# Patient Record
Sex: Male | Born: 1955 | Race: White | Hispanic: No | Smoking: Former smoker
Health system: Southern US, Community
[De-identification: ages and names within clinical notes are randomized; demographics above are authoritative.]

## PROBLEM LIST (undated history)

## (undated) DIAGNOSIS — E785 Hyperlipidemia, unspecified: Secondary | ICD-10-CM

## (undated) DIAGNOSIS — N2581 Secondary hyperparathyroidism of renal origin: Secondary | ICD-10-CM

## (undated) DIAGNOSIS — K7469 Other cirrhosis of liver: Secondary | ICD-10-CM

## (undated) DIAGNOSIS — K279 Peptic ulcer, site unspecified, unspecified as acute or chronic, without hemorrhage or perforation: Secondary | ICD-10-CM

## (undated) DIAGNOSIS — A4152 Sepsis due to Pseudomonas: Secondary | ICD-10-CM

## (undated) DIAGNOSIS — I959 Hypotension, unspecified: Secondary | ICD-10-CM

## (undated) DIAGNOSIS — I739 Peripheral vascular disease, unspecified: Secondary | ICD-10-CM

## (undated) DIAGNOSIS — I499 Cardiac arrhythmia, unspecified: Secondary | ICD-10-CM

## (undated) DIAGNOSIS — Z992 Dependence on renal dialysis: Secondary | ICD-10-CM

## (undated) DIAGNOSIS — D649 Anemia, unspecified: Secondary | ICD-10-CM

## (undated) DIAGNOSIS — A498 Other bacterial infections of unspecified site: Secondary | ICD-10-CM

## (undated) DIAGNOSIS — K759 Inflammatory liver disease, unspecified: Secondary | ICD-10-CM

## (undated) DIAGNOSIS — H269 Unspecified cataract: Secondary | ICD-10-CM

## (undated) DIAGNOSIS — J189 Pneumonia, unspecified organism: Secondary | ICD-10-CM

## (undated) DIAGNOSIS — S68119A Complete traumatic metacarpophalangeal amputation of unspecified finger, initial encounter: Secondary | ICD-10-CM

## (undated) DIAGNOSIS — F32A Depression, unspecified: Secondary | ICD-10-CM

## (undated) DIAGNOSIS — K219 Gastro-esophageal reflux disease without esophagitis: Secondary | ICD-10-CM

## (undated) DIAGNOSIS — E161 Other hypoglycemia: Secondary | ICD-10-CM

## (undated) DIAGNOSIS — M87059 Idiopathic aseptic necrosis of unspecified femur: Secondary | ICD-10-CM

## (undated) DIAGNOSIS — I4891 Unspecified atrial fibrillation: Secondary | ICD-10-CM

## (undated) DIAGNOSIS — S72009A Fracture of unspecified part of neck of unspecified femur, initial encounter for closed fracture: Secondary | ICD-10-CM

## (undated) DIAGNOSIS — K5900 Constipation, unspecified: Secondary | ICD-10-CM

## (undated) DIAGNOSIS — N186 End stage renal disease: Secondary | ICD-10-CM

## (undated) DIAGNOSIS — I770 Arteriovenous fistula, acquired: Secondary | ICD-10-CM

## (undated) DIAGNOSIS — E2740 Unspecified adrenocortical insufficiency: Secondary | ICD-10-CM

## (undated) DIAGNOSIS — F329 Major depressive disorder, single episode, unspecified: Secondary | ICD-10-CM

## (undated) DIAGNOSIS — IMO0002 Reserved for concepts with insufficient information to code with codable children: Secondary | ICD-10-CM

## (undated) DIAGNOSIS — K259 Gastric ulcer, unspecified as acute or chronic, without hemorrhage or perforation: Secondary | ICD-10-CM

## (undated) DIAGNOSIS — R1310 Dysphagia, unspecified: Secondary | ICD-10-CM

## (undated) DIAGNOSIS — I2510 Atherosclerotic heart disease of native coronary artery without angina pectoris: Secondary | ICD-10-CM

## (undated) DIAGNOSIS — F419 Anxiety disorder, unspecified: Secondary | ICD-10-CM

---

## 1992-04-21 DIAGNOSIS — N186 End stage renal disease: Secondary | ICD-10-CM

## 1992-04-21 HISTORY — DX: Dependence on renal dialysis: N18.6

## 2013-08-14 ENCOUNTER — Emergency Department (HOSPITAL_COMMUNITY): Payer: Medicare Other

## 2013-08-14 ENCOUNTER — Inpatient Hospital Stay (HOSPITAL_COMMUNITY)
Admission: EM | Admit: 2013-08-14 | Discharge: 2013-08-19 | DRG: 871 | Disposition: E | Payer: Medicare Other | Attending: Pulmonary Disease | Admitting: Pulmonary Disease

## 2013-08-14 ENCOUNTER — Encounter (HOSPITAL_COMMUNITY): Payer: Self-pay | Admitting: Emergency Medicine

## 2013-08-14 ENCOUNTER — Inpatient Hospital Stay (HOSPITAL_COMMUNITY): Payer: Medicare Other

## 2013-08-14 DIAGNOSIS — R6521 Severe sepsis with septic shock: Secondary | ICD-10-CM

## 2013-08-14 DIAGNOSIS — D539 Nutritional anemia, unspecified: Secondary | ICD-10-CM | POA: Diagnosis present

## 2013-08-14 DIAGNOSIS — F3289 Other specified depressive episodes: Secondary | ICD-10-CM | POA: Diagnosis present

## 2013-08-14 DIAGNOSIS — G9340 Encephalopathy, unspecified: Secondary | ICD-10-CM | POA: Diagnosis present

## 2013-08-14 DIAGNOSIS — S68118A Complete traumatic metacarpophalangeal amputation of other finger, initial encounter: Secondary | ICD-10-CM

## 2013-08-14 DIAGNOSIS — Z9115 Patient's noncompliance with renal dialysis: Secondary | ICD-10-CM

## 2013-08-14 DIAGNOSIS — Z7982 Long term (current) use of aspirin: Secondary | ICD-10-CM

## 2013-08-14 DIAGNOSIS — R4182 Altered mental status, unspecified: Secondary | ICD-10-CM

## 2013-08-14 DIAGNOSIS — I96 Gangrene, not elsewhere classified: Secondary | ICD-10-CM | POA: Diagnosis present

## 2013-08-14 DIAGNOSIS — I4891 Unspecified atrial fibrillation: Secondary | ICD-10-CM

## 2013-08-14 DIAGNOSIS — L89109 Pressure ulcer of unspecified part of back, unspecified stage: Secondary | ICD-10-CM | POA: Diagnosis present

## 2013-08-14 DIAGNOSIS — Z91158 Patient's noncompliance with renal dialysis for other reason: Secondary | ICD-10-CM

## 2013-08-14 DIAGNOSIS — Z992 Dependence on renal dialysis: Secondary | ICD-10-CM

## 2013-08-14 DIAGNOSIS — L8992 Pressure ulcer of unspecified site, stage 2: Secondary | ICD-10-CM | POA: Diagnosis present

## 2013-08-14 DIAGNOSIS — K219 Gastro-esophageal reflux disease without esophagitis: Secondary | ICD-10-CM | POA: Diagnosis present

## 2013-08-14 DIAGNOSIS — L8995 Pressure ulcer of unspecified site, unstageable: Secondary | ICD-10-CM | POA: Diagnosis present

## 2013-08-14 DIAGNOSIS — D649 Anemia, unspecified: Secondary | ICD-10-CM

## 2013-08-14 DIAGNOSIS — I469 Cardiac arrest, cause unspecified: Secondary | ICD-10-CM | POA: Diagnosis present

## 2013-08-14 DIAGNOSIS — A419 Sepsis, unspecified organism: Principal | ICD-10-CM | POA: Diagnosis present

## 2013-08-14 DIAGNOSIS — E119 Type 2 diabetes mellitus without complications: Secondary | ICD-10-CM | POA: Diagnosis present

## 2013-08-14 DIAGNOSIS — R188 Other ascites: Secondary | ICD-10-CM | POA: Diagnosis present

## 2013-08-14 DIAGNOSIS — F411 Generalized anxiety disorder: Secondary | ICD-10-CM | POA: Diagnosis present

## 2013-08-14 DIAGNOSIS — R6520 Severe sepsis without septic shock: Secondary | ICD-10-CM

## 2013-08-14 DIAGNOSIS — M87059 Idiopathic aseptic necrosis of unspecified femur: Secondary | ICD-10-CM | POA: Diagnosis present

## 2013-08-14 DIAGNOSIS — E2749 Other adrenocortical insufficiency: Secondary | ICD-10-CM | POA: Diagnosis present

## 2013-08-14 DIAGNOSIS — S72009A Fracture of unspecified part of neck of unspecified femur, initial encounter for closed fracture: Secondary | ICD-10-CM | POA: Diagnosis present

## 2013-08-14 DIAGNOSIS — E876 Hypokalemia: Secondary | ICD-10-CM | POA: Diagnosis present

## 2013-08-14 DIAGNOSIS — K7460 Unspecified cirrhosis of liver: Secondary | ICD-10-CM | POA: Diagnosis present

## 2013-08-14 DIAGNOSIS — L98499 Non-pressure chronic ulcer of skin of other sites with unspecified severity: Secondary | ICD-10-CM | POA: Diagnosis present

## 2013-08-14 DIAGNOSIS — N2581 Secondary hyperparathyroidism of renal origin: Secondary | ICD-10-CM | POA: Diagnosis present

## 2013-08-14 DIAGNOSIS — J9600 Acute respiratory failure, unspecified whether with hypoxia or hypercapnia: Secondary | ICD-10-CM

## 2013-08-14 DIAGNOSIS — N186 End stage renal disease: Secondary | ICD-10-CM

## 2013-08-14 DIAGNOSIS — E785 Hyperlipidemia, unspecified: Secondary | ICD-10-CM | POA: Diagnosis present

## 2013-08-14 DIAGNOSIS — E873 Alkalosis: Secondary | ICD-10-CM | POA: Diagnosis present

## 2013-08-14 DIAGNOSIS — I2510 Atherosclerotic heart disease of native coronary artery without angina pectoris: Secondary | ICD-10-CM | POA: Diagnosis present

## 2013-08-14 DIAGNOSIS — F329 Major depressive disorder, single episode, unspecified: Secondary | ICD-10-CM | POA: Diagnosis present

## 2013-08-14 DIAGNOSIS — S98919A Complete traumatic amputation of unspecified foot, level unspecified, initial encounter: Secondary | ICD-10-CM

## 2013-08-14 DIAGNOSIS — Z66 Do not resuscitate: Secondary | ICD-10-CM | POA: Diagnosis present

## 2013-08-14 DIAGNOSIS — X58XXXA Exposure to other specified factors, initial encounter: Secondary | ICD-10-CM | POA: Diagnosis present

## 2013-08-14 DIAGNOSIS — J189 Pneumonia, unspecified organism: Secondary | ICD-10-CM | POA: Diagnosis present

## 2013-08-14 DIAGNOSIS — I739 Peripheral vascular disease, unspecified: Secondary | ICD-10-CM

## 2013-08-14 DIAGNOSIS — Z515 Encounter for palliative care: Secondary | ICD-10-CM

## 2013-08-14 DIAGNOSIS — Z794 Long term (current) use of insulin: Secondary | ICD-10-CM

## 2013-08-14 DIAGNOSIS — I214 Non-ST elevation (NSTEMI) myocardial infarction: Secondary | ICD-10-CM | POA: Diagnosis present

## 2013-08-14 DIAGNOSIS — R570 Cardiogenic shock: Secondary | ICD-10-CM

## 2013-08-14 DIAGNOSIS — E872 Acidosis, unspecified: Secondary | ICD-10-CM | POA: Diagnosis present

## 2013-08-14 DIAGNOSIS — IMO0002 Reserved for concepts with insufficient information to code with codable children: Secondary | ICD-10-CM

## 2013-08-14 DIAGNOSIS — L89309 Pressure ulcer of unspecified buttock, unspecified stage: Secondary | ICD-10-CM | POA: Diagnosis present

## 2013-08-14 DIAGNOSIS — E46 Unspecified protein-calorie malnutrition: Secondary | ICD-10-CM | POA: Diagnosis present

## 2013-08-14 DIAGNOSIS — Z87891 Personal history of nicotine dependence: Secondary | ICD-10-CM

## 2013-08-14 DIAGNOSIS — D696 Thrombocytopenia, unspecified: Secondary | ICD-10-CM | POA: Diagnosis present

## 2013-08-14 HISTORY — DX: Anemia, unspecified: D64.9

## 2013-08-14 HISTORY — DX: Dysphagia, unspecified: R13.10

## 2013-08-14 HISTORY — DX: Unspecified atrial fibrillation: I48.91

## 2013-08-14 HISTORY — DX: Complete traumatic metacarpophalangeal amputation of unspecified finger, initial encounter: S68.119A

## 2013-08-14 HISTORY — DX: Inflammatory liver disease, unspecified: K75.9

## 2013-08-14 HISTORY — DX: Sepsis due to Pseudomonas: A41.52

## 2013-08-14 HISTORY — DX: Hypotension, unspecified: I95.9

## 2013-08-14 HISTORY — DX: Dependence on renal dialysis: Z99.2

## 2013-08-14 HISTORY — DX: Atherosclerotic heart disease of native coronary artery without angina pectoris: I25.10

## 2013-08-14 HISTORY — DX: End stage renal disease: N18.6

## 2013-08-14 HISTORY — DX: Gastric ulcer, unspecified as acute or chronic, without hemorrhage or perforation: K25.9

## 2013-08-14 HISTORY — DX: Unspecified cataract: H26.9

## 2013-08-14 HISTORY — DX: Other bacterial infections of unspecified site: A49.8

## 2013-08-14 HISTORY — DX: Reserved for concepts with insufficient information to code with codable children: IMO0002

## 2013-08-14 HISTORY — DX: Constipation, unspecified: K59.00

## 2013-08-14 HISTORY — DX: Depression, unspecified: F32.A

## 2013-08-14 HISTORY — DX: Idiopathic aseptic necrosis of unspecified femur: M87.059

## 2013-08-14 HISTORY — DX: Secondary hyperparathyroidism of renal origin: N25.81

## 2013-08-14 HISTORY — DX: Peripheral vascular disease, unspecified: I73.9

## 2013-08-14 HISTORY — DX: Gastro-esophageal reflux disease without esophagitis: K21.9

## 2013-08-14 HISTORY — DX: Cardiac arrhythmia, unspecified: I49.9

## 2013-08-14 HISTORY — DX: Other cirrhosis of liver: K74.69

## 2013-08-14 HISTORY — DX: Other hypoglycemia: E16.1

## 2013-08-14 HISTORY — DX: Arteriovenous fistula, acquired: I77.0

## 2013-08-14 HISTORY — DX: Pneumonia, unspecified organism: J18.9

## 2013-08-14 HISTORY — DX: Fracture of unspecified part of neck of unspecified femur, initial encounter for closed fracture: S72.009A

## 2013-08-14 HISTORY — DX: Major depressive disorder, single episode, unspecified: F32.9

## 2013-08-14 HISTORY — DX: Unspecified adrenocortical insufficiency: E27.40

## 2013-08-14 HISTORY — DX: Anxiety disorder, unspecified: F41.9

## 2013-08-14 HISTORY — DX: Peptic ulcer, site unspecified, unspecified as acute or chronic, without hemorrhage or perforation: K27.9

## 2013-08-14 HISTORY — DX: Hyperlipidemia, unspecified: E78.5

## 2013-08-14 LAB — COMPREHENSIVE METABOLIC PANEL
ALK PHOS: 182 U/L — AB (ref 39–117)
ALT: 20 U/L (ref 0–53)
ALT: 22 U/L (ref 0–53)
AST: 40 U/L — AB (ref 0–37)
AST: 64 U/L — AB (ref 0–37)
Albumin: 1.7 g/dL — ABNORMAL LOW (ref 3.5–5.2)
Albumin: 1.6 g/dL — ABNORMAL LOW (ref 3.5–5.2)
Alkaline Phosphatase: 175 U/L — ABNORMAL HIGH (ref 39–117)
BILIRUBIN TOTAL: 1.1 mg/dL (ref 0.3–1.2)
BUN: 27 mg/dL — ABNORMAL HIGH (ref 6–23)
BUN: 27 mg/dL — ABNORMAL HIGH (ref 6–23)
CHLORIDE: 93 meq/L — AB (ref 96–112)
CO2: 21 mEq/L (ref 19–32)
CO2: 18 meq/L — AB (ref 19–32)
CREATININE: 2.82 mg/dL — AB (ref 0.50–1.35)
Calcium: 9 mg/dL (ref 8.4–10.5)
Calcium: 9.1 mg/dL (ref 8.4–10.5)
Chloride: 92 mEq/L — ABNORMAL LOW (ref 96–112)
Creatinine, Ser: 2.84 mg/dL — ABNORMAL HIGH (ref 0.50–1.35)
GFR calc Af Amer: 26 mL/min — ABNORMAL LOW (ref >90)
GFR calc Af Amer: 27 mL/min — ABNORMAL LOW (ref >90)
GFR calc non Af Amer: 23 mL/min — ABNORMAL LOW (ref >90)
GFR, EST NON AFRICAN AMERICAN: 23 mL/min — AB (ref >90)
GLUCOSE: 124 mg/dL — AB (ref 70–99)
GLUCOSE: 101 mg/dL — AB (ref 70–99)
Potassium: 3.4 mEq/L — ABNORMAL LOW (ref 3.7–5.3)
Potassium: 4.1 mEq/L (ref 3.7–5.3)
SODIUM: 137 meq/L (ref 137–147)
SODIUM: 134 meq/L — AB (ref 137–147)
Total Bilirubin: 1.3 mg/dL — ABNORMAL HIGH (ref 0.3–1.2)
Total Protein: 5.6 g/dL — ABNORMAL LOW (ref 6.0–8.3)
Total Protein: 5.3 g/dL — ABNORMAL LOW (ref 6.0–8.3)

## 2013-08-14 LAB — CBC
HCT: 33.5 % — ABNORMAL LOW (ref 39.0–52.0)
Hemoglobin: 10.7 g/dL — ABNORMAL LOW (ref 13.0–17.0)
MCH: 34.7 pg — ABNORMAL HIGH (ref 26.0–34.0)
MCHC: 31.9 g/dL (ref 30.0–36.0)
MCV: 108.8 fL — AB (ref 78.0–100.0)
PLATELETS: 157 10*3/uL (ref 150–400)
RBC: 3.08 MIL/uL — ABNORMAL LOW (ref 4.22–5.81)
RDW: 16.1 % — ABNORMAL HIGH (ref 11.5–15.5)
WBC: 24.3 10*3/uL — AB (ref 4.0–10.5)

## 2013-08-14 LAB — I-STAT TROPONIN, ED: Troponin i, poc: 0.08 ng/mL (ref 0.00–0.08)

## 2013-08-14 LAB — I-STAT ARTERIAL BLOOD GAS, ED
Acid-Base Excess: 1 mmol/L (ref 0.0–2.0)
Bicarbonate: 26.4 meq/L — ABNORMAL HIGH (ref 20.0–24.0)
O2 Saturation: 100 %
Patient temperature: 98.7
TCO2: 28 mmol/L (ref 0–100)
pCO2 arterial: 45.6 mmHg — ABNORMAL HIGH (ref 35.0–45.0)
pH, Arterial: 7.37 (ref 7.350–7.450)
pO2, Arterial: 238 mmHg — ABNORMAL HIGH (ref 80.0–100.0)

## 2013-08-14 LAB — CBC WITH DIFFERENTIAL/PLATELET
Basophils Absolute: 0 10*3/uL (ref 0.0–0.1)
Basophils Relative: 0 % (ref 0–1)
Eosinophils Absolute: 0.1 10*3/uL (ref 0.0–0.7)
Eosinophils Relative: 0 % (ref 0–5)
HEMATOCRIT: 31.3 % — AB (ref 39.0–52.0)
HEMOGLOBIN: 10.1 g/dL — AB (ref 13.0–17.0)
LYMPHS ABS: 0.8 10*3/uL (ref 0.7–4.0)
LYMPHS PCT: 3 % — AB (ref 12–46)
MCH: 34.2 pg — ABNORMAL HIGH (ref 26.0–34.0)
MCHC: 32.3 g/dL (ref 30.0–36.0)
MCV: 106.1 fL — ABNORMAL HIGH (ref 78.0–100.0)
MONO ABS: 2.7 10*3/uL — AB (ref 0.1–1.0)
MONOS PCT: 11 % (ref 3–12)
NEUTROS ABS: 20.8 10*3/uL — AB (ref 1.7–7.7)
Neutrophils Relative %: 86 % — ABNORMAL HIGH (ref 43–77)
Platelets: 124 10*3/uL — ABNORMAL LOW (ref 150–400)
RBC: 2.95 MIL/uL — AB (ref 4.22–5.81)
RDW: 15.9 % — AB (ref 11.5–15.5)
WBC: 24.4 10*3/uL — AB (ref 4.0–10.5)

## 2013-08-14 LAB — I-STAT CHEM 8, ED
BUN: 26 mg/dL — AB (ref 6–23)
Calcium, Ion: 1.12 mmol/L (ref 1.12–1.23)
Chloride: 97 mEq/L (ref 96–112)
Creatinine, Ser: 2.9 mg/dL — ABNORMAL HIGH (ref 0.50–1.35)
Glucose, Bld: 119 mg/dL — ABNORMAL HIGH (ref 70–99)
HCT: 34 % — ABNORMAL LOW (ref 39.0–52.0)
HEMOGLOBIN: 11.6 g/dL — AB (ref 13.0–17.0)
Potassium: 3.1 mEq/L — ABNORMAL LOW (ref 3.7–5.3)
SODIUM: 134 meq/L — AB (ref 137–147)
TCO2: 23 mmol/L (ref 0–100)

## 2013-08-14 LAB — PRO B NATRIURETIC PEPTIDE
Pro B Natriuretic peptide (BNP): 7638 pg/mL — ABNORMAL HIGH (ref 0–125)
Pro B Natriuretic peptide (BNP): 7668 pg/mL — ABNORMAL HIGH (ref 0–125)

## 2013-08-14 LAB — I-STAT CG4 LACTIC ACID, ED: Lactic Acid, Venous: 7.39 mmol/L — ABNORMAL HIGH (ref 0.5–2.2)

## 2013-08-14 LAB — TROPONIN I
Troponin I: <0.3 ng/mL (ref <0.30)
Troponin I: 1.06 ng/mL (ref <0.30)

## 2013-08-14 LAB — PROTIME-INR
INR: 1.2 (ref 0.00–1.49)
INR: 1.26 (ref 0.00–1.49)
Prothrombin Time: 14.9 seconds (ref 11.6–15.2)
Prothrombin Time: 15.5 seconds — ABNORMAL HIGH (ref 11.6–15.2)

## 2013-08-14 LAB — GLUCOSE, CAPILLARY
GLUCOSE-CAPILLARY: 89 mg/dL (ref 70–99)
GLUCOSE-CAPILLARY: 91 mg/dL (ref 70–99)

## 2013-08-14 LAB — MRSA PCR SCREENING: MRSA by PCR: NEGATIVE

## 2013-08-14 LAB — CBG MONITORING, ED: Glucose-Capillary: 92 mg/dL (ref 70–99)

## 2013-08-14 LAB — LACTIC ACID, PLASMA: Lactic Acid, Venous: 6.1 mmol/L — ABNORMAL HIGH (ref 0.5–2.2)

## 2013-08-14 LAB — MAGNESIUM: Magnesium: 2.5 mg/dL (ref 1.5–2.5)

## 2013-08-14 MED ORDER — LORAZEPAM 2 MG/ML IJ SOLN
2.0000 mg | Freq: Once | INTRAMUSCULAR | Status: AC
  Administered 2013-08-14: 2 mg via INTRAVENOUS
  Filled 2013-08-14: qty 1

## 2013-08-14 MED ORDER — CHLORHEXIDINE GLUCONATE 0.12 % MT SOLN
15.0000 mL | Freq: Two times a day (BID) | OROMUCOSAL | Status: DC
  Administered 2013-08-14 – 2013-08-17 (×6): 15 mL via OROMUCOSAL
  Filled 2013-08-14 (×6): qty 15

## 2013-08-14 MED ORDER — INSULIN ASPART 100 UNIT/ML ~~LOC~~ SOLN: 2.0000 [IU] | SUBCUTANEOUS | Status: DC

## 2013-08-14 MED ORDER — IPRATROPIUM-ALBUTEROL 20-100 MCG/ACT IN AERS: 6.0000 | INHALATION_SPRAY | RESPIRATORY_TRACT | Status: DC

## 2013-08-14 MED ORDER — FENTANYL CITRATE 0.05 MG/ML IJ SOLN
100.0000 ug | INTRAMUSCULAR | Status: DC | PRN
  Administered 2013-08-14 – 2013-08-15 (×2): 100 ug via INTRAVENOUS
  Administered 2013-08-15: 50 ug via INTRAVENOUS
  Administered 2013-08-15 – 2013-08-17 (×2): 100 ug via INTRAVENOUS
  Filled 2013-08-14 (×5): qty 2

## 2013-08-14 MED ORDER — MIDODRINE HCL 5 MG PO TABS
20.0000 mg | ORAL_TABLET | Freq: Three times a day (TID) | ORAL | Status: DC
  Filled 2013-08-14 (×5): qty 4

## 2013-08-14 MED ORDER — HYDROCORTISONE NA SUCCINATE PF 100 MG IJ SOLR
50.0000 mg | Freq: Four times a day (QID) | INTRAMUSCULAR | Status: DC
  Administered 2013-08-14 – 2013-08-17 (×11): 50 mg via INTRAVENOUS
  Filled 2013-08-14 (×14): qty 1

## 2013-08-14 MED ORDER — MIDODRINE HCL 5 MG PO TABS
10.0000 mg | ORAL_TABLET | Freq: Three times a day (TID) | ORAL | Status: DC
  Filled 2013-08-14 (×2): qty 2

## 2013-08-14 MED ORDER — SODIUM CHLORIDE 0.9 % IV SOLN
1000.0000 mL | Freq: Once | INTRAVENOUS | Status: AC
  Administered 2013-08-14: 1000 mL via INTRAVENOUS

## 2013-08-14 MED ORDER — SODIUM CHLORIDE 0.9 % IV SOLN
1.0000 g | Freq: Once | INTRAVENOUS | Status: AC
  Administered 2013-08-14: 1 g via INTRAVENOUS
  Filled 2013-08-14: qty 10

## 2013-08-14 MED ORDER — DEXTROSE 5 % IV SOLN
1.0000 g | Freq: Once | INTRAVENOUS | Status: AC
  Administered 2013-08-15: 1 g via INTRAVENOUS
  Filled 2013-08-14: qty 1

## 2013-08-14 MED ORDER — IPRATROPIUM-ALBUTEROL 0.5-2.5 (3) MG/3ML IN SOLN
3.0000 mL | RESPIRATORY_TRACT | Status: DC
  Administered 2013-08-14 – 2013-08-17 (×16): 3 mL via RESPIRATORY_TRACT
  Filled 2013-08-14 (×17): qty 3

## 2013-08-14 MED ORDER — PROPOFOL 10 MG/ML IV EMUL
5.0000 ug/kg/min | INTRAVENOUS | Status: DC
  Administered 2013-08-14: 5 ug/kg/min via INTRAVENOUS
  Filled 2013-08-14: qty 100

## 2013-08-14 MED ORDER — SODIUM CHLORIDE 0.9 % IV SOLN
1000.0000 mL | INTRAVENOUS | Status: DC
  Administered 2013-08-14: 1000 mL via INTRAVENOUS

## 2013-08-14 MED ORDER — BIOTENE DRY MOUTH MT LIQD
15.0000 mL | Freq: Four times a day (QID) | OROMUCOSAL | Status: DC
  Administered 2013-08-15 – 2013-08-17 (×10): 15 mL via OROMUCOSAL

## 2013-08-14 MED ORDER — CIPROFLOXACIN IN D5W 400 MG/200ML IV SOLN
400.0000 mg | INTRAVENOUS | Status: DC
  Administered 2013-08-15: 400 mg via INTRAVENOUS
  Filled 2013-08-14 (×2): qty 200

## 2013-08-14 MED ORDER — FENTANYL CITRATE 0.05 MG/ML IJ SOLN
50.0000 ug | Freq: Once | INTRAMUSCULAR | Status: AC
  Administered 2013-08-14: 50 ug via INTRAVENOUS
  Filled 2013-08-14: qty 2

## 2013-08-14 MED ORDER — HEPARIN SODIUM (PORCINE) 5000 UNIT/ML IJ SOLN
5000.0000 [IU] | Freq: Three times a day (TID) | INTRAMUSCULAR | Status: DC
  Administered 2013-08-14: 5000 [IU] via SUBCUTANEOUS
  Filled 2013-08-14 (×2): qty 1

## 2013-08-14 MED ORDER — PANTOPRAZOLE SODIUM 40 MG IV SOLR
40.0000 mg | INTRAVENOUS | Status: DC
  Administered 2013-08-15 – 2013-08-16 (×2): 40 mg via INTRAVENOUS
  Filled 2013-08-14 (×4): qty 40

## 2013-08-14 NOTE — ED Notes (Signed)
One blood culture drawn per Dr.Beaver via femoral artery

## 2013-08-14 NOTE — Progress Notes (Signed)
10:06 PM  I spoke with the sister of Jacob Nicholson and she states that he gets HD on TTS and received HD yesterday, 4/25.  I also spoke with the son and he is unable to make a decision regarding code status.  The sister spoke with the patient and according to her, she asked Jacob Nicholson if he would want to be resuscitated again and she stated that he shook his head no.  She would like us to speak with the son again regarding his code status.    Marrian SalvageJacquelyn S Carzell Saldivar, MD

## 2013-08-14 NOTE — Progress Notes (Signed)
2 RN's from IV team assessed pt and attempted to obtain access. Unable to obtain peripheral access. Patient has a right arm PIV from previous hospital that is red at site. CCM MD aware.

## 2013-08-14 NOTE — ED Provider Notes (Signed)
CSN: [id number]     Arrival date & time 08/18/2013  1501 History   First MD Initiated Contact with Patient 08/06/2013 [phone]     Chief Complaint  Patient presents with  . Cardiac Arrest     (Consider location/radiation/quality/duration/timing/severity/associated sxs/prior Treatment) The history is provided by the EMS personnel.   history of present illness: 58 year old male with history of end-stage renal disease on hemodialysis, diabetes, liver disease and inoperable hip fracture who was being transferred from [facility] so that he could continue to receive dialysis. In route EMS reports the patient began to complain that he was short of breath within minutes she became unresponsive and was found to have a PEA arrest. No shockable rhythm was identified. He was given total of 3 mg epinephrine and had ROSC after the third milligram of epinephrine. Patient was intubated with a 7.0 ET tube. EMS reports patient has been refusing dialysis for the past 3 days. Prior to transfer the facility reported that his blood pressures have been running lower than normal in the 90s systolic.  Past Medical History  Diagnosis Date  . Pneumonia   . Pseudomonas infection   . Hip fracture   . Avascular necrosis of hip     lt HIP  . End stage renal failure on dialysis   . Constipation   . PAD (peripheral artery disease)   . Depression   . Hypotension   . Peptic ulcer disease   . A-fib   . Hyperlipidemia   . Anemia    History reviewed. No pertinent past surgical history. History reviewed. No pertinent family history. History  Substance Use Topics  . Smoking status: Former Games developer  . Smokeless tobacco: Not on file  . Alcohol Use: No    Review of Systems  Unable to perform ROS: Patient unresponsive      Allergies  Hectorol  Home Medications   Prior to Admission medications   Not on File   BP 73/54  Pulse 117  Temp(Src) 98.8 F (37.1 C) (Oral)  Resp 20  Ht 5'  5" (1.651 m)  Wt 201 lb 8 oz (91.4 kg)  BMI 33.53 kg/m2  SpO2 100% Physical Exam  Nursing note and vitals reviewed. Constitutional: He appears well-developed and well-nourished. He appears ill. He is intubated.  HENT:  Head: Normocephalic and atraumatic.  Eyes: Pupils are equal, round, and reactive to light.  Neck: Neck supple.  Cardiovascular: Regular rhythm.  Tachycardia present.   Pulses:      Carotid pulses are 2+ on the right side, and 2+ on the left side.      Femoral pulses are 2+ on the right side, and 2+ on the left side. Pulmonary/Chest: He is intubated. He has no decreased breath sounds. He has rhonchi.  Abdominal: Normal appearance and bowel sounds are normal. He exhibits distension.  Musculoskeletal:  Necrotic right 2nd and 3rd digits  Neurological: He is unresponsive. GCS eye subscore is 1. GCS verbal subscore is 1. GCS motor subscore is 1.  Skin: Skin is warm and dry.    ED Course  Procedures (including critical care time) Labs Review Labs Reviewed  CBC - Abnormal; Notable for the following:    WBC 24.3 (*)    RBC 3.08 (*)    Hemoglobin 10.7 (*)    HCT 33.5 (*)    MCV 108.8 (*)    MCH 34.7 (*)    RDW 16.1 (*)    All other components within normal  limits  COMPREHENSIVE METABOLIC PANEL - Abnormal; Notable for the following:    Potassium 3.4 (*)    Chloride 93 (*)    Glucose, Bld 124 (*)    BUN 27 (*)    Creatinine, Ser 2.84 (*)    Total Protein 5.6 (*)    Albumin 1.7 (*)    AST 40 (*)    Alkaline Phosphatase 182 (*)    GFR calc non Af Amer 23 (*)    GFR calc Af Amer 26 (*)    All other components within normal limits  PRO B NATRIURETIC PEPTIDE - Abnormal; Notable for the following:    Pro B Natriuretic peptide (BNP) 7638.0 (*)    All other components within normal limits  COMPREHENSIVE METABOLIC PANEL - Abnormal; Notable for the following:    Sodium 134 (*)    Chloride 92 (*)    CO2 18 (*)    Glucose, Bld 101 (*)    BUN 27 (*)    Creatinine, Ser  2.82 (*)    Total Protein 5.3 (*)    Albumin 1.6 (*)    AST 64 (*)    Alkaline Phosphatase 175 (*)    Total Bilirubin 1.3 (*)    GFR calc non Af Amer 23 (*)    GFR calc Af Amer 27 (*)    All other components within normal limits  LACTIC ACID, PLASMA - Abnormal; Notable for the following:    Lactic Acid, Venous 6.1 (*)    All other components within normal limits  PRO B NATRIURETIC PEPTIDE - Abnormal; Notable for the following:    Pro B Natriuretic peptide (BNP) 7668.0 (*)    All other components within normal limits  TROPONIN I - Abnormal; Notable for the following:    Troponin I 1.06 (*)    All other components within normal limits  CBC WITH DIFFERENTIAL - Abnormal; Notable for the following:    WBC 24.4 (*)    RBC 2.95 (*)    Hemoglobin 10.1 (*)    HCT 31.3 (*)    MCV 106.1 (*)    MCH 34.2 (*)    RDW 15.9 (*)    Platelets 124 (*)    Neutrophils Relative % 86 (*)    Neutro Abs 20.8 (*)    Lymphocytes Relative 3 (*)    Monocytes Absolute 2.7 (*)    All other components within normal limits  PROTIME-INR - Abnormal; Notable for the following:    Prothrombin Time 15.5 (*)    All other components within normal limits  I-STAT CHEM 8, ED - Abnormal; Notable for the following:    Sodium 134 (*)    Potassium 3.1 (*)    BUN 26 (*)    Creatinine, Ser 2.90 (*)    Glucose, Bld 119 (*)    Hemoglobin 11.6 (*)    HCT 34.0 (*)    All other components within normal limits  I-STAT CG4 LACTIC ACID, ED - Abnormal; Notable for the following:    Lactic Acid, Venous 7.39 (*)    All other components within normal limits  I-STAT ARTERIAL BLOOD GAS, ED - Abnormal; Notable for the following:    pCO2 arterial 45.6 (*)    pO2, Arterial 238.0 (*)    Bicarbonate 26.4 (*)    All other components within normal limits  MRSA PCR SCREENING  CULTURE, BLOOD (ROUTINE X 2)  CULTURE, BLOOD (ROUTINE X 2)  CULTURE, EXPECTORATED SPUTUM-ASSESSMENT  PROTIME-INR  MAGNESIUM  TROPONIN I  GLUCOSE, CAPILLARY   GLUCOSE, CAPILLARY  URINALYSIS, ROUTINE W REFLEX MICROSCOPIC  CBC WITH DIFFERENTIAL  BLOOD GAS, ARTERIAL  CORTISOL  URINALYSIS, ROUTINE W REFLEX MICROSCOPIC  CBC  BASIC METABOLIC PANEL  MAGNESIUM  PHOSPHORUS  TROPONIN I  BLOOD GAS, ARTERIAL  HEPARIN LEVEL (UNFRACTIONATED)  CBC  LACTIC ACID, PLASMA  CBG MONITORING, ED  I-STAT TROPOININ, ED    Imaging Review Dg Chest Portable 1 View  09/07/2013   CLINICAL DATA:  Post CPR, intubated  EXAM: PORTABLE CHEST - 1 VIEW  COMPARISON:  None.  FINDINGS: The tip of the endotracheal tube is 1.8 cm above the carina. Inspiratory volumes are very low. Cardiopericardial silhouette is enlarged likely representing cardiomegaly. Nonspecific right perihilar opacities may reflect atelectasis or infiltrate. No pneumothorax or pleural effusion. Advanced degenerative changes of the bilateral shoulder joints. No acute osseous abnormality.  IMPRESSION: 1. The tip of the endotracheal tube is 1.8 cm above the carina. 2. Low inspiratory volumes with the right mid lung and perihilar airspace opacity which could reflect atelectasis or infiltrate. 3. Cardiomegaly.   Electronically Signed   By: Malachy Moan M.D.   On: 09/02/2013 15:44   Dg Chest Port 1v Same Day  09/12/2013   CLINICAL DATA:  Postprocedure SCU film; SS positioning of support tubes and lines.  EXAM: PORTABLE CHEST - 1 VIEW SAME DAY  COMPARISON:  DG CHEST 1V PORT dated 09/07/2013  FINDINGS: The endotracheal tube tip still lies approximately 1.8 cm above the crotch of the carina. The lung volumes are low. The pulmonary interstitial markings have increased bilaterally since the study earlier today. The cardiopericardial silhouette remains enlarged. The pulmonary vascularity is more engorged and indistinct. There is mild gaseous distention of the stomach or bowel in the left upper quadrant of the abdomen. No nasogastric tube is evident. There is no pleural effusion or pneumothorax.  IMPRESSION: 1. There is  persistent bilateral pulmonary hypoinflation. The interstitial markings have increased bilaterally suggesting worsening interstitial edema. 2. The endotracheal tube tip lies approximately 1.8 cm above the crotch of the carina. Withdrawal by 1 cm would be useful to avoid accidental right mainstem bronchus intubation with patient movement. 3. There is mild gaseous distention of the stomach or bowel in the left upper quadrant of the abdomen.   Electronically Signed   By: David  [place]   On: 09/13/2013 17:52     EKG Interpretation   Date/Time:  Sunday 08/25/2013 15:08:08 EDT Ventricular Rate:  112 PR Interval:    QRS Duration: 89 QT Interval:  378 QTC Calculation: 516 R Axis:   68 Text Interpretation:  Atrial fibrillation Premature ventricular complexes  Aberrant conduction of SV complex(es) Low voltage, extremity leads  Borderline repolarization abnormality Prolonged QT interval Abnormal ekg  No previous tracing Confirmed by [facility]  MD, Russella Dar ([id number]) on 19-Aug-2013  4:03:08 PM      MDM   Final diagnoses:  Cardiac arrest    58 year old male history of end-stage renal disease on hemodialysis who has missed dialysis for the past 3 days, diabetes, liver disease, inoperable hip fracture who presents status post CPR while being transferred from [place] [facility]. Received total 3 mg epinephrine then had ROSC. He was intubated during the code with a 7.0 ET tube.  On arrival patient was initially GCS 3. Mental status slowly improved while in the emergency department patient with purposeful movements but unable to follow commands or be extubated. Hypotensive on arrival but was given IV fluids normalization of blood  pressure. He was also given calcium gluconate empirically due to concern for hyperkalemia in the setting of missing dialysis.  Labs remarkable for a normal potassium. Lactic acid 7.4. Leukocytosis of 24. Blood cultures were sent.  History of low blood pressures  earlier today at his facility and concern the patient became hypotensive leading to his arrest.  Critical care consulted and will admit for further management.  Cherre RobinsBryan Letina Luckett, MD 08/15/13 [phone]

## 2013-08-14 NOTE — Progress Notes (Signed)
**Note De-Identified Jacob Nicholson Obfuscation** RT note: ETT pulled back 2cm; currently 25cm at lip

## 2013-08-14 NOTE — ED Notes (Addendum)
PT has been a current PT at [facility] and was in rout [facility] . EMS reported PT was alert and talking to transport staff. Pt became un responsive to staff and was observed to be PEA. CPR started ,Intubation and 3 doses of EPI given IV. EMS staff intubated with  #7 and placed 27 @ the lip..Marland Kitchen

## 2013-08-14 NOTE — Progress Notes (Signed)
**Note De-Identified Jacob Nicholson Obfuscation** RT note: ETCO2 monitored; patient post CPR

## 2013-08-14 NOTE — Procedures (Deleted)
**Note De-Identified Fermon Ureta Obfuscation** Extubation Procedure Note  Patient Details:   Name: Jacob DibbleRandy L Nicholson DOB: 1955-10-03 MRN: [id number]   Airway Documentation:     Evaluation  O2 sats: patient extubated to comfort care Complications: No apparent complications Patient did not tolerate procedure well. Bilateral Breath Sounds: Clear;Diminished   No  Megan SalonWendy Cooper Kaitlynd Phillips 08/18/2013, 4:40 PM

## 2013-08-14 NOTE — H&P (Signed)
PULMONARY / CRITICAL CARE MEDICINE   Name: Jacob Nicholson MRN: [id number] DOB: Mar 12, 1956    ADMISSION DATE:  08/13/2013 CONSULTATION DATE:  4/26   REFERRING MD :  Oletta Lamas  PRIMARY SERVICE: PCCM   CHIEF COMPLAINT:  Respiratory arrest   BRIEF PATIENT DESCRIPTION:    58 male who resides at SNF w/ ESRD just discharged from [facility] and [facility];  after admission from 4/17 to 4/27 where he was treated for pneumonia, pseudomonas bacteremia, and severe left hip pain d/t avascular necrosis, and femoral neck fracture which when referred to [facility] was returned back and deemed not an operable candidate. He was felt stable to d/c to [facility] where he could get HD in the room and continue supportive care as well as pain management. While in route to [facility] he developed acute shortness of breath, chest discomfort and subsequently became unresponsive f/b PEA arrest. He was intubated, underwent 2 rounds of CPR, w/ rapid ROSC. In er he was awake on vent. Followed some commands. PCCM asked to admit.   SIGNIFICANT EVENTS / STUDIES:  Echo 4/26>>>  LINES / TUBES: OETT 4/26>>>  CULTURES: bc x2 4/26>>>  ANTIBIOTICS: cipro 4/26>>> Cefepime 4/26>>>  HISTORY OF PRESENT ILLNESS:   58 year old male w/ ESRD just discharged from [facility] and was in route to be transported to [facility]  After admission from 4/17 to 4/27 where he was treated for pneumonia, pseudomonas bacteremia, and severe left hip pain d/t avascular necrosis, and femoral neck fracture which when referred to [facility] was returned back and deemed not an operable candidate. He was felt stable to d/c to [facility] where he could get HD in the room and continue supportive care as well as pain management. While in route to [facility] he developed acute shortness of breath, chest discomfort and subsequently became unresponsive f/b PEA arrest. He was intubated, underwent 2 rounds of CPR,  w/ rapid ROSC. In er he was awake on vent. Followed some commands. PCCM asked to admit.  PAST MEDICAL HISTORY :  Past Medical History  Diagnosis Date  . Pneumonia   . Pseudomonas infection   . Hip fracture   . Avascular necrosis of hip     lt HIP  . End stage renal failure on dialysis   . Constipation   . PAD (peripheral artery disease)   . Depression   . Hypotension   . Peptic ulcer disease   . A-fib   . Hyperlipidemia   . Anemia    History reviewed. No pertinent past surgical history. Prior to Admission medications   Medication Sig Start Date End Date Taking? Authorizing Provider  aspirin EC 81 MG tablet Take 81 mg by mouth daily.   Yes Historical Provider, MD  B-Complex-C-Biotin-Minerals-FA (FOLBEE PLUS CZ PO) Take 1 tablet by mouth at bedtime.   Yes Historical Provider, MD  bisacodyl (DULCOLAX) 5 MG EC tablet Take 5 mg by mouth daily as needed (constipation).   Yes Historical Provider, MD  Cefepime HCl 2 GM/100ML SOLN Inject 1 g into the vein daily.   Yes Historical Provider, MD  cinacalcet (SENSIPAR) 60 MG tablet Take 60 mg by mouth 3 (three) times a week. Tuesday, Thursday and Saturday   Yes Historical Provider, MD  ciprofloxacin (CIPRO IN D5W) 400 MG/200ML SOLN Inject 400 mg into the vein daily.   Yes Historical Provider, MD  docusate sodium (COLACE) 100 MG capsule Take 100 mg by mouth 2 (  two) times daily.   Yes Historical Provider, MD  DULoxetine (CYMBALTA) 20 MG capsule Take 20 mg by mouth at bedtime.   Yes Historical Provider, MD  fentaNYL (DURAGESIC - DOSED MCG/HR) 25 MCG/HR patch Place 25 mcg onto the skin Nicholson 3 (three) days.   Yes Historical Provider, MD  folic acid (FOLVITE) 1 MG tablet Take 1 mg by mouth 2 (two) times daily.   Yes Historical Provider, MD  lactulose (CHRONULAC) 10 GM/15ML solution Take 20 g by mouth 3 (three) times daily.   Yes Historical Provider, MD  MAGNESIUM HYDROXIDE PO Take 30 mLs by mouth daily as needed (constipation). 8% oral suspension   Yes  Historical Provider, MD  methocarbamol (ROBAXIN) 500 MG tablet Take 500 mg by mouth 3 (three) times daily.   Yes Historical Provider, MD  midodrine (PROAMATINE) 5 MG tablet Take 20 mg by mouth 3 (three) times daily with meals.   Yes Historical Provider, MD  omeprazole (PRILOSEC) 40 MG capsule Take 40 mg by mouth 2 (two) times daily.   Yes Historical Provider, MD  oxyCODONE-acetaminophen (PERCOCET) 10-325 MG per tablet Take 1 tablet by mouth Nicholson 6 (six) hours as needed for pain.   Yes Historical Provider, MD  pentoxifylline (TRENTAL) 400 MG CR tablet Take 400 mg by mouth Nicholson evening.   Yes Historical Provider, MD  polyethylene glycol (MIRALAX / GLYCOLAX) packet Take 17 g by mouth daily.   Yes Historical Provider, MD  predniSONE (DELTASONE) 5 MG tablet Take 5 mg by mouth 2 (two) times a week. On Tuesday Thursday and Saturday   Yes Historical Provider, MD  sevelamer carbonate (RENVELA) 800 MG tablet Take 2,400 mg by mouth 3 (three) times daily before meals.   Yes Historical Provider, MD  Vitamin D, Ergocalciferol, (DRISDOL) 50000 UNITS CAPS capsule Take 50,000 Units by mouth Nicholson 30 (thirty) days. On the 1st of each month   Yes Historical Provider, MD   Allergies  Allergen Reactions  . Hectorol [Doxercalciferol] Other (See Comments)    Unknown ([facility])    FAMILY HISTORY:  History reviewed. No pertinent family history. SOCIAL HISTORY:  reports that he has quit smoking. He does not have any smokeless tobacco history on file. He reports that he does not drink alcohol or use illicit drugs.  REVIEW OF SYSTEMS:  Unable   SUBJECTIVE:  On vent  VITAL SIGNS: Temp:  [92.5 F (33.6 C)-95.9 F (35.5 C)] 95.7 F (35.4 C) (04/26 1624) Pulse Rate:  [92-116] 94 (04/26 1624) Resp:  [16-29] 20 (04/26 1624) BP: (68-127)/(23-92) 85/54 mmHg (04/26 1616) SpO2:  [95 %-100 %] 100 % (04/26 1624) FiO2 (%):  [40 %-100 %] 40 % (04/26 1623) Weight:  [87 kg (191 lb 12.8  oz)] 87 kg (191 lb 12.8 oz) (04/26 1557) HEMODYNAMICS:   VENTILATOR SETTINGS: Vent Mode:  [-] PRVC FiO2 (%):  [40 %-100 %] 40 % Set Rate:  [20 bmp] 20 bmp Vt Set:  [580 mL] 580 mL PEEP:  [5 cmH20] 5 cmH20 INTAKE / OUTPUT: Intake/Output     04/25 0701 - 04/26 0700 04/26 0701 - 04/27 0700   I.V. (mL/kg)  1000 (11.5)   Total Intake(mL/kg)  1000 (11.5)   Stool  1   Total Output   1   Net   +999          PHYSICAL EXAMINATION: General:  Chronically ill appearing white male. Now on full vent support  Neuro:  Awake, follows commands. Moves extremities  HEENT:  Poor dentition, orally intubated  Cardiovascular:  irreg irreg  Lungs:  Diffuse rhonchi  Abdomen:  Distended, soft, + bowel sounds  Musculoskeletal:  Generalized weakness Skin:  Diffuse anasarca. Multiple ulcerations and non-healing wounds. Most specifically right ring finger dry-gangrenous wound w/ bone exposure, sacral ulcer not stagable, right ant LE w/ non-stag able ulcer, multiple areas of ecchymosis LABS:  CBC  Recent Labs Lab Aug 29, 2013 1530 2013-08-29 1541  WBC 24.3*  --   HGB 10.7* 11.6*  HCT 33.5* 34.0*  PLT 157  --    Coag's  Recent Labs Lab 08-29-2013 1530  INR 1.20   BMET  Recent Labs Lab 2013/08/29 1530 08/29/13 1541  NA 137 134*  K 3.4* 3.1*  CL 93* 97  CO2 21  --   BUN 27* 26*  CREATININE 2.84* 2.90*  GLUCOSE 124* 119*   Electrolytes  Recent Labs Lab 08-29-2013 1530  CALCIUM 9.0  MG 2.5   Sepsis Markers  Recent Labs Lab 08/29/2013 1541  LATICACIDVEN 7.39*   ABG  Recent Labs Lab 08-29-13 1536  PHART 7.370  PCO2ART 45.6*  PO2ART 238.0*   Liver Enzymes  Recent Labs Lab 08/29/13 1530  AST 40*  ALT 20  ALKPHOS 182*  BILITOT 1.1  ALBUMIN 1.7*   Cardiac Enzymes  Recent Labs Lab 08-29-2013 1530  PROBNP 7638.0*   Glucose  Recent Labs Lab 2013-08-29 1510  GLUCAP 92    Imaging Dg Chest Portable 1 View  08/29/2013   CLINICAL DATA:  Post CPR, intubated  EXAM: PORTABLE  CHEST - 1 VIEW  COMPARISON:  None.  FINDINGS: The tip of the endotracheal tube is 1.8 cm above the carina. Inspiratory volumes are very low. Cardiopericardial silhouette is enlarged likely representing cardiomegaly. Nonspecific right perihilar opacities may reflect atelectasis or infiltrate. No pneumothorax or pleural effusion. Advanced degenerative changes of the bilateral shoulder joints. No acute osseous abnormality.  IMPRESSION: 1. The tip of the endotracheal tube is 1.8 cm above the carina. 2. Low inspiratory volumes with the right mid lung and perihilar airspace opacity which could reflect atelectasis or infiltrate. 3. Cardiomegaly.   Electronically Signed   By: Malachy Moan M.D.   On: 2013/08/29 15:44     CXR: low volume, ETT good position. Right sided airspace disease   ASSESSMENT / PLAN:  PULMONARY A: acute respiratory failure      Cardiopulmonary arrest 4/26     HCAP He actually looks like he could wean, but because we don't have clear etiology of cardiac arrest will hold off until more data available P:   Full vent support F/u abg/cxr Obtain cultures  See ID section   CARDIOVASCULAR A:  PEA arrest  Atrial fib  Chronic hypotension on Midodrine  H/o PVD Wonder if this was cardiac in nature. Not clear what precipitated this arrest. He is NOT a candidate for hypothermia .Aggressive care in this pt appears futile. We will not offer central access or pressors. Have already spoken to the son and began discussion about goals of care.  P:  Admit to ICU  Accept sbp of >80 Tele, cardiac enzymes, echo all ordered  RENAL A:   ESRD Lactic acidosis  No urgent indication for HD Mild hypokalemia  P:    Will consult nephrology in am 4/27 2 runs KCL   GASTROINTESTINAL A:  Protein cal malnutrition  P:   Place gastric tube PPI for SUP  HEMATOLOGIC A:   Anemia  P:  [place] heparin  Trend CBC  INFECTIOUS A:   Recent pneumonia  Recent pseudomonas bacteremia (apparently  source was non-healing gangrenous right ring finger wound). Most recent blood cultures were negative   P:   Pan culture  Empiric GNR coverage Wound ostomy consult  ENDOCRINE A:  DM P:   ssi   NEUROLOGIC A:  Acute encephalopathy       Chronic hip pain  P:   Supportive care  Musculoskeletal  A: Avascular necrosis of hip Left hip fracture  [facility], [facility] back to [place] as deemed NOT A SURGICAL CANDIDATE.  P: Prn analgesia   TODAY'S SUMMARY:  This is an unfortunate 58 year old male w/ ESRD, non-healing wounds, recent pseudomonas bacteremia from dry gangrenous right ring finger wound w/ bone exposure. [facility] he was admitted for acute encephalopathy after he refused HD due to his chronic left hip pain. [facility] which was turned down and sent back. He was to be transferred to [facility] for HD and pain management. While in route he suffered a cardiopulmonary arrest of unclear etiology. This man is not a candidate for any aggressive interventions. We will admit him to the intensive care. Cycle enzymes, obtain ECHO, try to establish etiology of arrest. His highest priority should be discussion about goals of care as his current plan which was [facility] where he can get [facility] and pain medication does not address his several non-treatable issues.    I have personally obtained a history, examined the patient, evaluated laboratory and imaging results, formulated the assessment and plan and placed orders. CRITICAL CARE: The patient is critically ill with multiple organ systems failure and requires high complexity decision making for assessment and support, frequent evaluation and titration of therapies, application of advanced monitoring technologies and extensive interpretation of multiple databases. Critical Care Time devoted to patient care services described in this note is 60  minutes.   Alyson ReedyWesam G. Karter Haire, M.D. Pulmonary and Critical Care Medicine [facility] Pager: [phone]  08/04/2013, 5:14 PM

## 2013-08-14 NOTE — ED Provider Notes (Signed)
I saw and evaluated the patient, reviewed the resident's note and I agree with the findings and plan.   EKG Interpretation None      Pt was being transferred to [facility], has been refusing dialysis.  Pt arrested en route, PEA, responded to epi rounds. Pt arrives more alert, but not following commands, intubated,  Initially hypotensive, receiving IVF's.  Pt cannot be extubated because mental status is still not back to reported baseline.  Pt appears chronically ill, has dry gangrene of fingers, sig ulcerations and likely gangrene of toes bilaterally.  Will give sedation as BP improves with IVF's.  Plan to discuss with intensivist to see and admit to ICU.  Eventual plan to transfer to [facility] once more stable.  Will get plain films, labs, continue aggressive IVF resuscitation despite being dialysis since his airway is protected, consider central line and pressors as needed.  I spoke to a family member who lives far away to provide an update.  She was agreeable to treatment thus far.    CRITICAL CARE Performed by: Gavin PoundMichael Y. Kesley Gaffey Total critical care time: 30 min Critical care time was exclusive of separately billable procedures and treating other patients. Critical care was necessary to treat or prevent imminent or life-threatening deterioration. Critical care was time spent personally by me on the following activities: development of treatment plan with patient and/or surrogate as well as nursing, discussions with consultants, evaluation of patient's response to treatment, examination of patient, obtaining history from patient or surrogate, ordering and performing treatments and interventions, ordering and review of laboratory studies, ordering and review of radiographic studies, pulse oximetry and re-evaluation of patient's condition. \   Results for orders placed during the hospital encounter of 08/09/2013 (from the past 24 hour(s))  CBG MONITORING, ED     Status: None   Collection Time   07/20/2013  3:10 PM      Result Value Ref Range   Glucose-Capillary 92  70 - 99 mg/dL  CBC     Status: Abnormal   Collection Time    07/26/2013  3:30 PM      Result Value Ref Range   WBC 24.3 (*) 4.0 - 10.5 K/uL   RBC 3.08 (*) 4.22 - 5.81 MIL/uL   Hemoglobin 10.7 (*) 13.0 - 17.0 g/dL   HCT 21.333.5 (*) 08.639.0 - 57.852.0 %   MCV 108.8 (*) 78.0 - 100.0 fL   MCH 34.7 (*) 26.0 - 34.0 pg   MCHC 31.9  30.0 - 36.0 g/dL   RDW 46.916.1 (*) 62.911.5 - 52.815.5 %   Platelets 157  150 - 400 K/uL  COMPREHENSIVE METABOLIC PANEL     Status: Abnormal   Collection Time    08/11/2013  3:30 PM      Result Value Ref Range   Sodium 137  137 - 147 mEq/L   Potassium 3.4 (*) 3.7 - 5.3 mEq/L   Chloride 93 (*) 96 - 112 mEq/L   CO2 21  19 - 32 mEq/L   Glucose, Bld 124 (*) 70 - 99 mg/dL   BUN 27 (*) 6 - 23 mg/dL   Creatinine, Ser 4.132.84 (*) 0.50 - 1.35 mg/dL   Calcium 9.0  8.4 - 24.410.5 mg/dL   Total Protein 5.6 (*) 6.0 - 8.3 g/dL   Albumin 1.7 (*) 3.5 - 5.2 g/dL   AST 40 (*) 0 - 37 U/L   ALT 20  0 - 53 U/L   Alkaline Phosphatase 182 (*) 39 - 117 U/L  Total Bilirubin 1.1  0.3 - 1.2 mg/dL   GFR calc non Af Amer 23 (*) >90 mL/min   GFR calc Af Amer 26 (*) >90 mL/min  PROTIME-INR     Status: None   Collection Time    08/09/2013  3:30 PM      Result Value Ref Range   Prothrombin Time 14.9  11.6 - 15.2 seconds   INR 1.20  0.00 - 1.49  MAGNESIUM     Status: None   Collection Time    08/10/2013  3:30 PM      Result Value Ref Range   Magnesium 2.5  1.5 - 2.5 mg/dL  PRO B NATRIURETIC PEPTIDE     Status: Abnormal   Collection Time    07/25/2013  3:30 PM      Result Value Ref Range   Pro B Natriuretic peptide (BNP) 7638.0 (*) 0 - 125 pg/mL  I-STAT ARTERIAL BLOOD GAS, ED     Status: Abnormal   Collection Time    07/31/2013  3:36 PM      Result Value Ref Range   pH, Arterial 7.370  7.350 - 7.450   pCO2 arterial 45.6 (*) 35.0 - 45.0 mmHg   pO2, Arterial 238.0 (*) 80.0 - 100.0 mmHg   Bicarbonate 26.4 (*) 20.0 - 24.0 mEq/L   TCO2 28  0 -  100 mmol/L   O2 Saturation 100.0     Acid-Base Excess 1.0  0.0 - 2.0 mmol/L   Patient temperature 98.7 F     Collection site ARTERIAL LINE     Drawn by :MD     Sample type ARTERIAL    I-STAT TROPOININ, ED     Status: None   Collection Time    08/13/2013  3:39 PM      Result Value Ref Range   Troponin i, poc 0.08  0.00 - 0.08 ng/mL   Comment 3           I-STAT CHEM 8, ED     Status: Abnormal   Collection Time    08/06/2013  3:41 PM      Result Value Ref Range   Sodium 134 (*) 137 - 147 mEq/L   Potassium 3.1 (*) 3.7 - 5.3 mEq/L   Chloride 97  96 - 112 mEq/L   BUN 26 (*) 6 - 23 mg/dL   Creatinine, Ser 1.612.90 (*) 0.50 - 1.35 mg/dL   Glucose, Bld 096119 (*) 70 - 99 mg/dL   Calcium, Ion 0.451.12  4.091.12 - 1.23 mmol/L   TCO2 23  0 - 100 mmol/L   Hemoglobin 11.6 (*) 13.0 - 17.0 g/dL   HCT 81.134.0 (*) 91.439.0 - 78.252.0 %  I-STAT CG4 LACTIC ACID, ED     Status: Abnormal   Collection Time    07/30/2013  3:41 PM      Result Value Ref Range   Lactic Acid, Venous 7.39 (*) 0.5 - 2.2 mmol/L      Filed Vitals:   08/16/2013 1750  BP: 72/52  Pulse: 99  Temp: 96.8 F (36 C)  Resp: 21     Impression: Hypotension Possible PEA arrest SIRS      Gavin PoundMichael Y. Alyxandra Tenbrink, MD 08/10/2013 1827

## 2013-08-14 NOTE — Progress Notes (Signed)
ANTIBIOTIC CONSULT NOTE - INITIAL  Pharmacy Consult for cipro, cefepime  Indication: pneumonia, wound infection  Allergies  Allergen Reactions  . Hectorol [Doxercalciferol] Other (See Comments)    Unknown ([facility]    Patient Measurements: Height: 5\' 5"  (165.1 cm) ([facility] Weight: 191 lb 12.8 oz (87 kg) IBW/kg (Calculated) : 61.5   Vital Signs: Temp: 95.7 F (35.4 C) (04/26 1624) Temp src: Core (Comment) (04/26 1606) BP: 85/54 mmHg (04/26 1616) Pulse Rate: 94 (04/26 1624) Intake/Output from previous day:   Intake/Output from this shift: Total I/O In: 1000 [I.V.:1000] Out: 1 [Stool:1]  Labs:  Recent Labs  07/26/2013 1530 08/10/2013 1541  WBC 24.3*  --   HGB 10.7* 11.6*  PLT 157  --   CREATININE 2.84* 2.90*   Estimated Creatinine Clearance: 27.8 ml/min (by C-G formula based on Cr of 2.9). No results found for this basename: VANCOTROUGH, VANCOPEAK, VANCORANDOM, GENTTROUGH, GENTPEAK, GENTRANDOM, TOBRATROUGH, TOBRAPEAK, TOBRARND, AMIKACINPEAK, AMIKACINTROU, AMIKACIN,  in the last 72 hours   Microbiology: No results found for this or any previous visit (from the past 720 hour(s)).  Medical History: Past Medical History  Diagnosis Date  . Pneumonia   . Pseudomonas infection   . Hip fracture   . Avascular necrosis of hip     lt HIP  . End stage renal failure on dialysis   . Constipation   . PAD (peripheral artery disease)   . Depression   . Hypotension   . Peptic ulcer disease   . A-fib   . Hyperlipidemia   . Anemia     Medications:  See medication history Assessment: 58 yo man who was [facility] when he became unresponsive.  He was on cefepime 1gm IV q24 and cipro 400 mg IV q24 with last doses this am.  He is ESRD on HD TThS.  Goal of Therapy:  Eradication of infection  Plan:  Continue cipro 400 mg IV q24 hours Will give cefepime 1gm X 1 in  am then start dosing 2gm after each HD Will f/u when next HD will be scheduled Monitor cultures and clinical progress  Shalese Strahan Poteet Leiya Keesey 07/25/2013,5:37 PM

## 2013-08-15 ENCOUNTER — Inpatient Hospital Stay (HOSPITAL_COMMUNITY): Payer: Medicare Other

## 2013-08-15 ENCOUNTER — Encounter (HOSPITAL_COMMUNITY): Payer: Self-pay | Admitting: *Deleted

## 2013-08-15 DIAGNOSIS — I369 Nonrheumatic tricuspid valve disorder, unspecified: Secondary | ICD-10-CM

## 2013-08-15 DIAGNOSIS — I4891 Unspecified atrial fibrillation: Secondary | ICD-10-CM

## 2013-08-15 LAB — POCT I-STAT 3, ART BLOOD GAS (G3+)
Acid-Base Excess: 5 mmol/L — ABNORMAL HIGH (ref 0.0–2.0)
Bicarbonate: 26.7 mEq/L — ABNORMAL HIGH (ref 20.0–24.0)
O2 Saturation: 95 %
PH ART: 7.563 — AB (ref 7.350–7.450)
TCO2: 28 mmol/L (ref 0–100)
pCO2 arterial: 29.6 mmHg — ABNORMAL LOW (ref 35.0–45.0)
pO2, Arterial: 66 mmHg — ABNORMAL LOW (ref 80.0–100.0)

## 2013-08-15 LAB — BASIC METABOLIC PANEL
BUN: 30 mg/dL — AB (ref 6–23)
CALCIUM: 9.5 mg/dL (ref 8.4–10.5)
CO2: 25 meq/L (ref 19–32)
Chloride: 92 mEq/L — ABNORMAL LOW (ref 96–112)
Creatinine, Ser: 3.11 mg/dL — ABNORMAL HIGH (ref 0.50–1.35)
GFR calc Af Amer: 24 mL/min — ABNORMAL LOW (ref >90)
GFR, EST NON AFRICAN AMERICAN: 20 mL/min — AB (ref >90)
GLUCOSE: 108 mg/dL — AB (ref 70–99)
Potassium: 3.4 mEq/L — ABNORMAL LOW (ref 3.7–5.3)
Sodium: 133 mEq/L — ABNORMAL LOW (ref 137–147)

## 2013-08-15 LAB — CBC
HCT: 30.1 % — ABNORMAL LOW (ref 39.0–52.0)
HCT: 32.7 % — ABNORMAL LOW (ref 39.0–52.0)
HEMOGLOBIN: 9.7 g/dL — AB (ref 13.0–17.0)
Hemoglobin: 10.6 g/dL — ABNORMAL LOW (ref 13.0–17.0)
MCH: 33.9 pg (ref 26.0–34.0)
MCH: 34.5 pg — ABNORMAL HIGH (ref 26.0–34.0)
MCHC: 32.2 g/dL (ref 30.0–36.0)
MCHC: 32.4 g/dL (ref 30.0–36.0)
MCV: 105.2 fL — AB (ref 78.0–100.0)
MCV: 106.5 fL — ABNORMAL HIGH (ref 78.0–100.0)
Platelets: 120 10*3/uL — ABNORMAL LOW (ref 150–400)
Platelets: 179 10*3/uL (ref 150–400)
RBC: 2.86 MIL/uL — AB (ref 4.22–5.81)
RBC: 3.07 MIL/uL — ABNORMAL LOW (ref 4.22–5.81)
RDW: 16.4 % — ABNORMAL HIGH (ref 11.5–15.5)
RDW: 17 % — ABNORMAL HIGH (ref 11.5–15.5)
WBC: 23 10*3/uL — ABNORMAL HIGH (ref 4.0–10.5)
WBC: 32 10*3/uL — AB (ref 4.0–10.5)

## 2013-08-15 LAB — TROPONIN I
TROPONIN I: 1.29 ng/mL — AB (ref <0.30)
TROPONIN I: 0.83 ng/mL — AB (ref <0.30)
TROPONIN I: 0.65 ng/mL — AB (ref <0.30)
Troponin I: 1.04 ng/mL (ref <0.30)

## 2013-08-15 LAB — MAGNESIUM: Magnesium: 2.3 mg/dL (ref 1.5–2.5)

## 2013-08-15 LAB — LACTIC ACID, PLASMA: Lactic Acid, Venous: 4.3 mmol/L — ABNORMAL HIGH (ref 0.5–2.2)

## 2013-08-15 LAB — GLUCOSE, CAPILLARY
GLUCOSE-CAPILLARY: 85 mg/dL (ref 70–99)
Glucose-Capillary: 104 mg/dL — ABNORMAL HIGH (ref 70–99)
Glucose-Capillary: 101 mg/dL — ABNORMAL HIGH (ref 70–99)
Glucose-Capillary: 101 mg/dL — ABNORMAL HIGH (ref 70–99)
Glucose-Capillary: 101 mg/dL — ABNORMAL HIGH (ref 70–99)

## 2013-08-15 LAB — HEPARIN LEVEL (UNFRACTIONATED)
HEPARIN UNFRACTIONATED: 0.34 [IU]/mL (ref 0.30–0.70)
Heparin Unfractionated: 0.5 IU/mL (ref 0.30–0.70)

## 2013-08-15 LAB — CORTISOL: Cortisol, Plasma: 21.4 ug/dL

## 2013-08-15 LAB — PHOSPHORUS: Phosphorus: 3.3 mg/dL (ref 2.3–4.6)

## 2013-08-15 MED ORDER — PNEUMOCOCCAL VAC POLYVALENT 25 MCG/0.5ML IJ INJ
0.5000 mL | INJECTION | INTRAMUSCULAR | Status: AC
  Administered 2013-08-16: 0.5 mL via INTRAMUSCULAR
  Filled 2013-08-15: qty 0.5

## 2013-08-15 MED ORDER — ASPIRIN 300 MG RE SUPP
300.0000 mg | Freq: Every day | RECTAL | Status: DC
  Administered 2013-08-15: 300 mg via RECTAL
  Filled 2013-08-15 (×2): qty 1

## 2013-08-15 MED ORDER — DEXTROSE 5 % IV SOLN
30.0000 ug/min | INTRAVENOUS | Status: DC
Start: 2013-08-15 — End: 2013-08-17
  Administered 2013-08-15: 60 ug/min via INTRAVENOUS
  Administered 2013-08-16: 30 ug/min via INTRAVENOUS
  Filled 2013-08-15 (×3): qty 4

## 2013-08-15 MED ORDER — MIDODRINE HCL 5 MG PO TABS: 20.0000 mg | ORAL_TABLET | Freq: Three times a day (TID) | ORAL | Status: DC

## 2013-08-15 MED ORDER — SODIUM CHLORIDE 0.9 % IV SOLN
1000.0000 mL | INTRAVENOUS | Status: DC
  Administered 2013-08-16: 250 mL via INTRAVENOUS

## 2013-08-15 MED ORDER — MIDODRINE HCL 5 MG PO TABS
10.0000 mg | ORAL_TABLET | Freq: Three times a day (TID) | ORAL | Status: DC
  Administered 2013-08-15 – 2013-08-16 (×2): 10 mg
  Filled 2013-08-15 (×5): qty 2

## 2013-08-15 MED ORDER — HEPARIN (PORCINE) IN NACL 100-0.45 UNIT/ML-% IJ SOLN
1350.0000 [IU]/h | INTRAMUSCULAR | Status: DC
  Administered 2013-08-15: 1300 [IU]/h via INTRAVENOUS
  Administered 2013-08-15 – 2013-08-17 (×3): 1350 [IU]/h via INTRAVENOUS
  Filled 2013-08-15 (×5): qty 250

## 2013-08-15 MED ORDER — PHENYLEPHRINE HCL 10 MG/ML IJ SOLN
30.0000 ug/min | INTRAVENOUS | Status: DC
  Administered 2013-08-15: 30 ug/min via INTRAVENOUS
  Filled 2013-08-15: qty 1

## 2013-08-15 NOTE — Progress Notes (Signed)
ANTICOAGULATION CONSULT NOTE - Initial Consult  Pharmacy Consult for heparin Indication: r/o PE and NSTEMI  Allergies  Allergen Reactions  . Hectorol [Doxercalciferol] Other (See Comments)    Unknown ([facility])    Patient Measurements: Height: 5\' 5"  (165.1 cm) ([facility] Weight: 200 lb 13.4 oz (91.1 kg) IBW/kg (Calculated) : 61.5 Heparin Dosing Weight: 82kg  Vital Signs: Temp: 98.2 F (36.8 C) (04/27 1900) Temp src: Oral (04/27 1900) BP: 78/46 mmHg (04/27 1800) Pulse Rate: 109 (04/27 1800)  Labs:  Recent Labs  08/05/2013 1530 08/05/2013 1541  07/29/2013 1740  07/31/2013 2325 08/15/13 0330 08/15/13 0845 08/15/13 0900 08/15/13 1400 08/15/13 1716 08/15/13 1852  HGB 10.7* 11.6*  --   --   --  10.1* 9.7*  --   --   --  10.6*  --   HCT 33.5* 34.0*  --   --   --  31.3* 30.1*  --   --   --  32.7*  --   PLT 157  --   --   --   --  124* 120*  --   --   --  179  --   LABPROT 14.9  --   --   --   --  15.5*  --   --   --   --   --   --   INR 1.20  --   --   --   --  1.26  --   --   --   --   --   --   HEPARINUNFRC  --   --   --   --   --   --   --  0.34  --   --   --  0.50  CREATININE 2.84* 2.90*  --  2.82*  --   --  3.11*  --   --   --   --   --   TROPONINI  --   --   < >  --   < >  --  1.29*  --  1.04* 0.83*  --   --   < > = values in this interval not displayed.  Estimated Creatinine Clearance: 26.5 ml/min (by C-G formula based on Cr of 3.11).   Medical History: Past Medical History  Diagnosis Date  . Pneumonia   . Pseudomonas infection   . Hip fracture   . Avascular necrosis of hip     lt HIP  . End stage renal failure on dialysis 1994    hd tuesday, thursday, saturday  . Constipation   . PAD (peripheral artery disease)   . Depression   . Hypotension     on midodrine  . Peptic ulcer disease   . A-fib   . Hyperlipidemia   . Anemia   . Foot amputation status     amputation of all toes on  left foot, right foot   . A-V fistula     left arm  . Adrenal insufficiency     chronic predinsone use  . Swallowing difficulty   . Hyperparathyroidism, secondary     sensipar 3 times weekly  . Gastric ulcer   . Other specified hypoglycemia     due to poor po intake  . Sepsis due to Pseudomonas     [facility]  . 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 Pneumonia  left lower lobe  . PVD (peripheral vascular disease)   . Foot amputation status     Rt great toe and toes 3&4 amputated  . Amputation finger     left middle finger amputated  . Amputated finger     Right fingers 2, 4 & 5 snipped and necrotic  . Cataract of both eyes   . Coronary artery disease   . Dysrhythmia   . Anxiety   . GERD (gastroesophageal reflux disease)   . Hepatitis     Medications:  Prescriptions prior to admission  Medication Sig Dispense Refill  . acetaminophen (TYLENOL) 325 MG tablet Take 650 mg by mouth every 4 (four) hours as needed for mild pain or fever.      Marland Kitchen. aspirin EC 81 MG tablet Take 81 mg by mouth daily.      Marland Kitchen. B-Complex-C-Biotin-Minerals-FA (FOLBEE PLUS CZ PO) Take 1 tablet by mouth at bedtime.      . bisacodyl (DULCOLAX) 5 MG EC tablet Take 10 mg by mouth daily as needed for moderate constipation.      . Cefepime HCl 2 GM/100ML SOLN Inject 1 g into the vein daily.      . cinacalcet (SENSIPAR) 60 MG tablet Take 60 mg by mouth 3 (three) times a week. Tuesday, Thursday and Saturday      . ciprofloxacin (CIPRO IN D5W) 400 MG/200ML SOLN Inject 400 mg into the vein daily.      Marland Kitchen. docusate sodium (COLACE) 100 MG capsule Take 100 mg by mouth 2 (two) times daily.      . DULoxetine (CYMBALTA) 20 MG capsule Take 20 mg by mouth at bedtime.      . fentaNYL (DURAGESIC - DOSED MCG/HR) 25 MCG/HR patch Place 25 mcg onto the skin every 3 (three) days.      . folic acid (FOLVITE) 1 MG tablet Take 1 mg by mouth 2 (two) times daily.      Marland Kitchen.  lactulose (CHRONULAC) 10 GM/15ML solution Take 20 g by mouth 3 (three) times daily.      . methocarbamol (ROBAXIN) 500 MG tablet Take 500 mg by mouth 3 (three) times daily.      . midodrine (PROAMATINE) 5 MG tablet Take 20 mg by mouth 3 (three) times daily with meals.      Marland Kitchen. oxyCODONE (OXY IR/ROXICODONE) 5 MG immediate release tablet Take 10 mg by mouth every 4 (four) hours as needed for severe pain.      Marland Kitchen. oxymorphone (OPANA ER) 10 MG 12 hr tablet Take 20 mg by mouth every 12 (twelve) hours.      . pantoprazole (PROTONIX) 40 MG tablet Take 40 mg by mouth daily.      . pentoxifylline (TRENTAL) 400 MG CR tablet Take 400 mg by mouth every evening.      . polyethylene glycol (MIRALAX / GLYCOLAX) packet Take 17 g by mouth daily.      . predniSONE (DELTASONE) 5 MG tablet Take 5 mg by mouth 3 (three) times a week. On Tuesday, Thursday, Saturday      . sevelamer carbonate (RENVELA) 800 MG tablet Take 2,400 mg by mouth 3 (three) times daily before meals.      . Vitamin D, Ergocalciferol, (DRISDOL) 50000 UNITS CAPS capsule Take 50,000 Units by mouth every 30 (thirty) days. On the 1st of each month      . zolpidem (AMBIEN) 5 MG tablet Take 5 mg by mouth at bedtime as needed for sleep.  Scheduled:  . antiseptic oral rinse  15 mL Mouth Rinse QID  . aspirin  300 mg Rectal Daily  . chlorhexidine  15 mL Mouth Rinse BID  . ciprofloxacin  400 mg Intravenous Q24H  . hydrocortisone sodium succinate  50 mg Intravenous Q6H  . insulin aspart  2-6 Units Subcutaneous 6 times per day  . ipratropium-albuterol  3 mL Nebulization Q4H  . midodrine  10 mg Per Tube TID WC  . pantoprazole (PROTONIX) IV  40 mg Intravenous Q24H  . [START ON 08/16/2013] pneumococcal 23 valent vaccine  0.5 mL Intramuscular Tomorrow-1000   Infusions:  . sodium chloride    . heparin 1,350 Units/hr (08/15/13 1055)  . phenylephrine (NEO-SYNEPHRINE) Adult infusion 100 mcg/min (08/15/13 1830)    Assessment: 58yo male was en route to  transfer from [facility] to [facility] when became SOB w/ chest discomfort and subsequently became unresponsive w/ PEA arrest, rapid ROSC w/ intubation and 2 rounds of CPR, concern for PE and NSTEMI with elevated troponin, to begin heparin gtt. Patient also has h/o afib but only on ASA PTA. CHADS-VASc low at 1 and HAS-BLED score of 3, so likely ok to continue on ASA.   Heparin level 0.34 this remain in goal range at 0.5 this PM.   Goal of Therapy:  Heparin level 0.3-0.7 units/ml Monitor platelets by anticoagulation protocol: Yes   Plan:  Continue IV heparin at 1350 units/hr and recheck in AM.  Annis Lagoy S. Merilynn Finland, PharmD, [facility] Clinical Staff Pharmacist Pager [phone]   08/15/2013 7:58 PM

## 2013-08-15 NOTE — Progress Notes (Signed)
CRITICAL VALUE ALERT  Critical value received:  Troponin 1.06  Date of notification:  08/15/11  Time of notification:  2353  Critical value read back:yes  Nurse who received alert:  Corliss SkainsJuan Claudio RN  MD notified (1st page):  Dr. Tonny BranchGill(CCM)  Time of first page:  MD on unit-told in person 4/27 at 0005  MD notified (2nd page):NA  Time of second page:  Responding MD:  Dr. Delane GingerGill  Time MD responded:  4/27 at 0005

## 2013-08-15 NOTE — Progress Notes (Signed)
  Echocardiogram 2D Echocardiogram has been performed.  Lorn Junesngela D Hazyl Marseille 08/15/2013, 11:48 AM

## 2013-08-15 NOTE — Progress Notes (Addendum)
INITIAL NUTRITION ASSESSMENT  DOCUMENTATION CODES Per approved criteria  -Obesity Unspecified   INTERVENTION: If patient having swallowing difficulty, recommend swallow evaluation by SLP. Diet advancement as able  Pt may need supplementation as diet advances, due to increased nutrient needs for wound healing   NUTRITION DIAGNOSIS: Increased nutrient needs related to wound healing as evidenced by multiple wounds on chest, fingers, and leg.   Goal: Intake to meet >/= 90% of estimated nutrition needs   Monitor:  Diet advancement, weight trends, labs, I/Os  Reason for Assessment: Positive Malnutrition Screening Tool Score   58 y.o. male  Admitting Dx: Respiratory Arrest   ASSESSMENT: Patient is a [age] male who resides at SNF w/ ESRD [facility] and was being transported to [facility];While en route to [facility] he developed acute shortness of breath, chest discomfort and subsequently became unresponsive f/b PEA arrest. He was intubated (4/26), underwent 2 rounds of CPR, w/ rapid ROSC.  Pt extubated 4/27.   Pt unable to communicate for nutrition assessment. Discussed pt with RN- pt with history of poor PO intake and on Nepro Shake supplement TID at SNF.   Nutrition Focused Physical Exam:  Subcutaneous Fat:  Orbital Region: WNL Upper Arm Region: WNL Thoracic and Lumbar Region: NA  Muscle:  Temple Region: WNL Clavicle Bone Region: WNL Clavicle and Acromion Bone Region: WNL Scapular Bone Region: WNL Dorsal Hand: NA Patellar Region: WNL Anterior Thigh Region: WNL Posterior Calf Region: WNL  Edema: +1 generalized edema   Height: Ht Readings from Last 1 Encounters:  08/15/2013 5\' 5"  (1.651 m)    Weight: Wt Readings from Last 1 Encounters:  08/15/13 200 lb 13.4 oz (91.1 kg)    Ideal Body Weight: 61.8 kg   % Ideal Body Weight: 147%   Wt Readings from Last 10 Encounters:  08/15/13 200 lb 13.4 oz (91.1 kg)    Usual Body Weight:  200 lbs   % Usual Body Weight: 100%   BMI:  Body mass index is 33.42 kg/(m^2)., Obese Class I  Estimated Nutritional Needs: Kcal: 1900 - 2100 Protein: 125 - 140 g Fluid: 1.9- 2.1 L/ day  Skin:  Abrasion right side of chest wall Necrosed tissue right index finger Necrosed tissue to right ring finger (bone exposed) Open wound to anterior lower right leg   Diet Order: NPO  EDUCATION NEEDS: -Education not appropriate at this time   Intake/Output Summary (Last 24 hours) at 08/15/13 1053 Last data filed at 08/15/13 1000  Gross per 24 hour  Intake 2806.8 ml  Output      2 ml  Net 2804.8 ml    Last BM: 4/27    Labs:   Recent Labs Lab 08/15/2013 1530 07/28/2013 1541 07/25/2013 1740 08/15/13 0330  NA 137 134* 134* 133*  K 3.4* 3.1* 4.1 3.4*  CL 93* 97 92* 92*  CO2 21  --  18* 25  BUN 27* 26* 27* 30*  CREATININE 2.84* 2.90* 2.82* 3.11*  CALCIUM 9.0  --  9.1 9.5  MG 2.5  --   --  2.3  PHOS  --   --   --  3.3  GLUCOSE 124* 119* 101* 108*    CBG (last 3)   Recent Labs  08/07/2013 2332 08/15/13 0354 08/15/13 0754  GLUCAP 91 104* 101*    Scheduled Meds: . antiseptic oral rinse  15 mL Mouth Rinse QID  . aspirin  300 mg Rectal Daily  . chlorhexidine  15 mL Mouth Rinse  BID  . ciprofloxacin  400 mg Intravenous Q24H  . hydrocortisone sodium succinate  50 mg Intravenous Q6H  . insulin aspart  2-6 Units Subcutaneous 6 times per day  . ipratropium-albuterol  3 mL Nebulization Q4H  . midodrine  20 mg Oral TID WC  . pantoprazole (PROTONIX) IV  40 mg Intravenous Q24H  . [START ON 08/16/2013] pneumococcal 23 valent vaccine  0.5 mL Intramuscular Tomorrow-1000    Continuous Infusions: . sodium chloride    . heparin 1,300 Units/hr (08/15/13 0124)    Past Medical History  Diagnosis Date  . Pneumonia   . Pseudomonas infection   . Hip fracture   . Avascular necrosis of hip     lt HIP  . End stage renal failure on dialysis 1994    hd tuesday, thursday, saturday  .  Constipation   . PAD (peripheral artery disease)   . Depression   . Hypotension     on midodrine  . Peptic ulcer disease   . A-fib   . Hyperlipidemia   . Anemia   . 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 A-V fistula     left arm  . Adrenal insufficiency     chronic predinsone use  . Swallowing difficulty   . Hyperparathyroidism, secondary     sensipar 3 times weekly  . Gastric ulcer   . Other specified hypoglycemia     due to poor po intake    History reviewed. No pertinent past surgical history.  Eppie Gibsonebekah L Matznick, BS Dietetic Intern Pager: [phone]  I agree with student dietitian note; appropriate revisions have been made.  Joaquin CourtsKimberly Anala Whisenant, RD, LDN, CNSC Pager# [phone] After Hours Pager# [phone]

## 2013-08-15 NOTE — Care Management Note (Signed)
    Page 1 of 1   08/15/2013     9:02:21 AM CARE MANAGEMENT NOTE 08/15/2013  Patient:  Miguel DibbleJOHNSON,Chandon L   Account Number:  [id number]  Date Initiated:  08/15/2013  Documentation initiated by:  Junius CreamerWELL,DEBBIE  Subjective/Objective Assessment:   adm w esrd, vent     Action/Plan:   from snf   Anticipated DC Date:     Anticipated DC Plan:    In-house referral  Clinical Social Worker      DC Planning Services  CM consult      Choice offered to / List presented to:             Status of service:   Medicare Important Message given?   (If response is "NO", the following Medicare IM given date fields will be blank) Date Medicare IM given:   Date Additional Medicare IM given:    Discharge Disposition:    Per UR Regulation:  Reviewed for med. necessity/level of care/duration of stay  If discussed at Long Length of Stay Meetings, dates discussed:    Comments:

## 2013-08-15 NOTE — Progress Notes (Signed)
SLP Cancellation Note  Patient Details Name: Jacob Nicholson MRN: [id number] DOB: 1955-07-30   Cancelled treatment:        Attempted to see patient for swallow evaluation.  RN reports pt. Is too lethargic currently, and asks that SLP return 4/28.  Chart review completed, and pt. Will need an objective study prior to initiation of a diet, due to history of possible aspiration.   Lenor DerrickLori T Estephania Licciardi 08/15/2013, 4:37 PM

## 2013-08-15 NOTE — Procedures (Signed)
Extubation Procedure Note  Patient Details:   Name: Jacob DibbleRandy L Nicholson DOB: [birth year] MRN: [id number]   Airway Documentation:     Evaluation  O2 sats: stable throughout Complications: No apparent complications Patient did tolerate procedure well. Bilateral Breath Sounds: Clear Suctioning: Airway No Pt extubated per Dr. Delton CoombesByrum. Pt placed on 4lpm [place] Sp02 100%. Pt will continue to monitored in ICU today.   Melanee Spryicole Lawson Ugochukwu Chichester 08/15/2013, 9:54 AM

## 2013-08-15 NOTE — Progress Notes (Signed)
eLink Physician-Brief Progress Note Patient Name: Jacob DibbleRandy L Vasconcelos DOB: 02/04/1956 MRN: [id number]  Date of Service  08/15/2013   HPI/Events of Note   resp alkalosis  eICU Interventions  Decrease RR   Intervention Category Major Interventions: Respiratory failure - evaluation and management  Lupita LeashDouglas B McQuaid 08/15/2013, 4:39 AM

## 2013-08-15 NOTE — Progress Notes (Addendum)
PULMONARY / CRITICAL CARE MEDICINE   Name: Jacob Nicholson MRN: [id number] DOB: 08-Dec-1955    ADMISSION DATE:  Mar 02, 2014 CONSULTATION DATE:  4/26   REFERRING MD :  Oletta LamasGhiMiguel Dibblem  PRIMARY SERVICE: PCCM   CHIEF COMPLAINT:  Respiratory arrest   BRIEF PATIENT DESCRIPTION:   [id number] male who resides at SNF w/ ESRD [facility] and was being [facility];  after admission from 4/17 to 4/27 where he was treated for pneumonia, pseudomonas bacteremia, and severe left hip pain d/t avascular necrosis, and femoral neck fracture which when [facility] was returned back and deemed not an operable candidate. He was felt stable to d/c to [facility] where he could get HD in the room and continue supportive care as well as pain management. While en route to [facility] he developed acute shortness of breath, chest discomfort and subsequently became unresponsive f/b PEA arrest. He was intubated, underwent 2 rounds of CPR, w/ rapid ROSC. In ED he was awake on vent. Followed some commands. PCCM asked to admit.   SIGNIFICANT EVENTS / STUDIES:  Echo 4/26>>>  LINES / TUBES: OETT 4/26>>>  CULTURES: bc x2 4/26>>> Resp cx 4/27 >>>  ANTIBIOTICS: cipro 4/26>>> Cefepime 4/26>>>   Allergies  Allergen Reactions  . Hectorol [Doxercalciferol] Other (See Comments)    Unknown ([facility])    SUBJECTIVE:  No acute issues  VITAL SIGNS: Temp:  [92.5 F (33.6 C)-99 F (37.2 C)] 99 F (37.2 C) (04/27 0400) Pulse Rate:  [88-119] 116 (04/27 0532) Resp:  [14-29] 16 (04/27 0532) BP: (41-127)/(20-92) 105/88 mmHg (04/27 0532) SpO2:  [44 %-100 %] 100 % (04/27 0532) FiO2 (%):  [40 %-100 %] 40 % (04/27 0521) Weight:  [191 lb 12.8 oz (87 kg)-201 lb 8 oz (91.4 kg)] 200 lb 13.4 oz (91.1 kg) (04/27 0430) HEMODYNAMICS:   VENTILATOR SETTINGS: Vent Mode:  [-] PRVC FiO2 (%):  [40 %-100 %] 40 % Set Rate:  [14 bmp-20 bmp] 14 bmp Vt Set:  [580 mL] 580  mL PEEP:  [5 cmH20] 5 cmH20 Plateau Pressure:  [22 cmH20-23 cmH20] 22 cmH20 INTAKE / OUTPUT: Intake/Output     04/26 0701 - 04/27 0700   I.V. (mL/kg) 2296.8 (25.2)   Total Intake(mL/kg) 2296.8 (25.2)   Stool 2   Total Output 2   Net +2294.8       Stool Occurrence 2 x     PHYSICAL EXAMINATION: General:  Chronically ill appearing white male, no acute distress  Neuro: sedated but opens eyes to name, nods yes/no HEENT:  Poor dentition, orally intubated  Cardiovascular:  irreg irreg, tachycardic Lungs:  Diffuse rhonchi  Abdomen:  Distended, soft, + bowel sounds  Musculoskeletal:  Generalized weakness Skin:  Diffuse anasarca. Multiple ulcerations and non-healing wounds. Most specifically right ring finger dry-gangrenous wound w/ bone exposure, sacral ulcer not stagable, right ant LE w/ non-stag able ulcer, multiple areas of ecchymosis  LABS:  CBC  Recent Labs Lab Apr 17, 2014 1530 Apr 17, 2014 1541 Apr 17, 2014 2325 08/15/13 0330  WBC 24.3*  --  24.4* 23.0*  HGB 10.7* 11.6* 10.1* 9.7*  HCT 33.5* 34.0* 31.3* 30.1*  PLT 157  --  124* 120*   Coag's  Recent Labs Lab Apr 17, 2014 1530 Apr 17, 2014 2325  INR 1.20 1.26   BMET  Recent Labs Lab Apr 17, 2014 1530 Apr 17, 2014 1541 Apr 17, 2014 1740 08/15/13 0330  NA 137 134* 134* 133*  K 3.4* 3.1* 4.1 3.4*  CL 93* 97 92* 92*  CO2  21  --  18* 25  BUN 27* 26* 27* 30*  CREATININE 2.84* 2.90* 2.82* 3.11*  GLUCOSE 124* 119* 101* 108*   Electrolytes  Recent Labs Lab 08-25-2013 1530 08-25-2013 1740 08/15/13 0330  CALCIUM 9.0 9.1 9.5  MG 2.5  --  2.3  PHOS  --   --  3.3   Sepsis Markers  Recent Labs Lab 08/25/2013 1541 2013-08-25 1740 08/15/13 0330  LATICACIDVEN 7.39* 6.1* 4.3*   ABG  Recent Labs Lab 08-25-2013 1536 08/15/13 0402  PHART 7.370 7.563*  PCO2ART 45.6* 29.6*  PO2ART 238.0* 66.0*   Liver Enzymes  Recent Labs Lab 08/25/13 1530 Aug 25, 2013 1740  AST 40* 64*  ALT 20 22  ALKPHOS 182* 175*  BILITOT 1.1 1.3*  ALBUMIN 1.7* 1.6*    Cardiac Enzymes  Recent Labs Lab 2013/08/25 1530 08/25/2013 1659 Aug 25, 2013 2310 08/15/13 0330  TROPONINI  --  <0.30 1.06* 1.29*  PROBNP 7638.0* 7668.0*  --   --    Glucose  Recent Labs Lab Aug 25, 2013 1510 Aug 25, 2013 2021 08-25-13 2332 08/15/13 0354  GLUCAP 92 89 91 104*    Imaging Dg Chest Portable 1 View  08-25-2013   CLINICAL DATA:  Post CPR, intubated  EXAM: PORTABLE CHEST - 1 VIEW  COMPARISON:  None.  FINDINGS: The tip of the endotracheal tube is 1.8 cm above the carina. Inspiratory volumes are very low. Cardiopericardial silhouette is enlarged likely representing cardiomegaly. Nonspecific right perihilar opacities may reflect atelectasis or infiltrate. No pneumothorax or pleural effusion. Advanced degenerative changes of the bilateral shoulder joints. No acute osseous abnormality.  IMPRESSION: 1. The tip of the endotracheal tube is 1.8 cm above the carina. 2. Low inspiratory volumes with the right mid lung and perihilar airspace opacity which could reflect atelectasis or infiltrate. 3. Cardiomegaly.   Electronically Signed   By: Malachy Moan M.D.   On: 08/25/13 15:44   Dg Chest Port 1v Same Day  Aug 25, 2013   CLINICAL DATA:  Postprocedure SCU film; SS positioning of support tubes and lines.  EXAM: PORTABLE CHEST - 1 VIEW SAME DAY  COMPARISON:  DG CHEST 1V PORT dated 08-25-2013  FINDINGS: The endotracheal tube tip still lies approximately 1.8 cm above the crotch of the carina. The lung volumes are low. The pulmonary interstitial markings have increased bilaterally since the study earlier today. The cardiopericardial silhouette remains enlarged. The pulmonary vascularity is more engorged and indistinct. There is mild gaseous distention of the stomach or bowel in the left upper quadrant of the abdomen. No nasogastric tube is evident. There is no pleural effusion or pneumothorax.  IMPRESSION: 1. There is persistent bilateral pulmonary hypoinflation. The interstitial markings have  increased bilaterally suggesting worsening interstitial edema. 2. The endotracheal tube tip lies approximately 1.8 cm above the crotch of the carina. Withdrawal by 1 cm would be useful to avoid accidental right mainstem bronchus intubation with patient movement. 3. There is mild gaseous distention of the stomach or bowel in the left upper quadrant of the abdomen.   Electronically Signed   By: David  [place]   On: 08/25/2013 17:52    ASSESSMENT / PLAN:  PULMONARY A: Acute Respiratory Railure      Cardiopulmonary arrest 4/26     HCAP - RML infiltrate (CXR read from OSH suggests prior PNA was lingular infiltrate with left basilar consolidation) P:   - Full vent support, wean as able - F/u abg/cxr - Duonebs q4h - See ID section   CARDIOVASCULAR A:  PEA arrest  NSTEMI Atrial  fib - not on anticoag (?d/t h/o PUD with ulcer?) Chronic hypotension on Midodrine  H/o PVD - on Trental He is NOT a candidate for hypothermia.  Aggressive care in this pt appears futile. We will not offer central access or pressors. Dr. Molli KnockYacoub spoke with son at admission and began discussion about goals of care.  P:  - Accept SBP of >80 - Cont to cycle CE - Heparin gtt  - Follow Echo results - midodrine TID  - ASA suppository   RENAL A:   ESRD (TTS, last HD Sat 4/25) Lactic acidosis  Mild hypokalemia  P:   - Consult Nephrology this AM  GASTROINTESTINAL A:  Protein cal malnutrition  On Lactulose at home - constipation + cirrhosis (?cryptogenic per chart review) with mod ascites on CT at OSH P:   - PPI for SUP - Nutrition consult depending on GOC  HEMATOLOGIC A:   Macrocytic Anemia, stable Thrombocytopenia, no obvious s/s of bleeding VTE ppx P:  - Heparin gtt  - Trend CBC   INFECTIOUS A:   Leukocytosis Recent pneumonia (CXR with RML infiltrate) Recent pseudomonas bacteremia (apparently source was non-healing gangrenous right ring finger wound). Most recent blood cultures were negative   P:   -  Follow blood & resp cx - Empiric GNR coverage - Wound ostomy consult  ENDOCRINE A:  DM Chronic AI (prednisone 5mg  three times/week at home) P:   - SSI - Hydrocortisone q6h (stress)  NEUROLOGIC A:  Acute encephalopathy       Chronic hip pain  P:   - Supportive care  MSK A: Avascular necrosis of left femoral head Left hip fracture  [facility], [facility] [place] as deemed NOT A SURGICAL CANDIDATE.  P: - Fentanyl prn  GLOBAL:  Plan to discuss GOC with son today.  Stacy GardnerNeema Sharda, MD (IMTS, PGY3) 08/15/2013, 6:26 AM   CC time 45 minutes  Levy Pupaobert Audry Kauzlarich, MD, PhD 08/15/2013, 9:35 AM [facility] [phone] or if no answer [phone]

## 2013-08-15 NOTE — Progress Notes (Addendum)
ANTICOAGULATION CONSULT NOTE - Initial Consult  Pharmacy Consult for heparin Indication: r/o PE and NSTEMI  Allergies  Allergen Reactions  . Hectorol [Doxercalciferol] Other (See Comments)    Unknown (listed on [facility] records)    Patient Measurements: Height: 5\' 5"  (165.1 cm) (from transfere papers from [facility]) Weight: 200 lb 13.4 oz (91.1 kg) IBW/kg (Calculated) : 61.5 Heparin Dosing Weight: 82kg  Vital Signs: Temp: 99.5 F (37.5 C) (04/27 0800) Temp src: Oral (04/27 0800) BP: 64/39 mmHg (04/27 0800) Pulse Rate: 109 (04/27 0800)  Labs:  Recent Labs  07/23/2013 1530 08/08/2013 1541  08/13/2013 1740 08/10/2013 2310 08/11/2013 2325 08/15/13 0330 08/15/13 0845 08/15/13 0900  HGB 10.7* 11.6*  --   --   --  10.1* 9.7*  --   --   HCT 33.5* 34.0*  --   --   --  31.3* 30.1*  --   --   PLT 157  --   --   --   --  124* 120*  --   --   LABPROT 14.9  --   --   --   --  15.5*  --   --   --   INR 1.20  --   --   --   --  1.26  --   --   --   HEPARINUNFRC  --   --   --   --   --   --   --  0.34  --   CREATININE 2.84* 2.90*  --  2.82*  --   --  3.11*  --   --   TROPONINI  --   --   < >  --  1.06*  --  1.29*  --  1.04*  < > = values in this interval not displayed.  Estimated Creatinine Clearance: 26.5 ml/min (by C-G formula based on Cr of 3.11).   Medical History: Past Medical History  Diagnosis Date  . Pneumonia   . Pseudomonas infection   . Hip fracture   . Avascular necrosis of hip     lt HIP  . End stage renal failure on dialysis 1994    hd tuesday, thursday, saturday  . Constipation   . PAD (peripheral artery disease)   . Depression   . Hypotension     on midodrine  . Peptic ulcer disease   . A-fib   . Hyperlipidemia   . Anemia   . 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 A-V fistula     left arm  . Adrenal insufficiency     chronic predinsone use  . Swallowing difficulty   . Hyperparathyroidism,  secondary     sensipar 3 times weekly  . Gastric ulcer   . Other specified hypoglycemia     due to poor po intake    Medications:  Prescriptions prior to admission  Medication Sig Dispense Refill  . acetaminophen (TYLENOL) 325 MG tablet Take 650 mg by mouth every 4 (four) hours as needed for mild pain or fever.      Marland Kitchen aspirin EC 81 MG tablet Take 81 mg by mouth daily.      Marland Kitchen B-Complex-C-Biotin-Minerals-FA (FOLBEE PLUS CZ PO) Take 1 tablet by mouth at bedtime.      . bisacodyl (DULCOLAX) 5 MG EC tablet Take 10 mg by mouth daily as needed for moderate constipation.      . Cefepime HCl 2  GM/100ML SOLN Inject 1 g into the vein daily.      . cinacalcet (SENSIPAR) 60 MG tablet Take 60 mg by mouth 3 (three) times a week. Tuesday, Thursday and Saturday      . ciprofloxacin (CIPRO IN D5W) 400 MG/200ML SOLN Inject 400 mg into the vein daily.      Marland Kitchen. docusate sodium (COLACE) 100 MG capsule Take 100 mg by mouth 2 (two) times daily.      . DULoxetine (CYMBALTA) 20 MG capsule Take 20 mg by mouth at bedtime.      . fentaNYL (DURAGESIC - DOSED MCG/HR) 25 MCG/HR patch Place 25 mcg onto the skin every 3 (three) days.      . folic acid (FOLVITE) 1 MG tablet Take 1 mg by mouth 2 (two) times daily.      Marland Kitchen. lactulose (CHRONULAC) 10 GM/15ML solution Take 20 g by mouth 3 (three) times daily.      . methocarbamol (ROBAXIN) 500 MG tablet Take 500 mg by mouth 3 (three) times daily.      . midodrine (PROAMATINE) 5 MG tablet Take 20 mg by mouth 3 (three) times daily with meals.      Marland Kitchen. oxyCODONE (OXY IR/ROXICODONE) 5 MG immediate release tablet Take 10 mg by mouth every 4 (four) hours as needed for severe pain.      Marland Kitchen. oxymorphone (OPANA ER) 10 MG 12 hr tablet Take 20 mg by mouth every 12 (twelve) hours.      . pantoprazole (PROTONIX) 40 MG tablet Take 40 mg by mouth daily.      . pentoxifylline (TRENTAL) 400 MG CR tablet Take 400 mg by mouth every evening.      . polyethylene glycol (MIRALAX / GLYCOLAX) packet Take 17 g  by mouth daily.      . predniSONE (DELTASONE) 5 MG tablet Take 5 mg by mouth 2 (two) times a week. On Tuesday Thursday and Saturday      . sevelamer carbonate (RENVELA) 800 MG tablet Take 2,400 mg by mouth 3 (three) times daily before meals.      . Vitamin D, Ergocalciferol, (DRISDOL) 50000 UNITS CAPS capsule Take 50,000 Units by mouth every 30 (thirty) days. On the 1st of each month      . zolpidem (AMBIEN) 5 MG tablet Take 5 mg by mouth at bedtime as needed for sleep.       Scheduled:  . antiseptic oral rinse  15 mL Mouth Rinse QID  . aspirin  300 mg Rectal Daily  . chlorhexidine  15 mL Mouth Rinse BID  . ciprofloxacin  400 mg Intravenous Q24H  . hydrocortisone sodium succinate  50 mg Intravenous Q6H  . insulin aspart  2-6 Units Subcutaneous 6 times per day  . ipratropium-albuterol  3 mL Nebulization Q4H  . midodrine  20 mg Oral TID WC  . pantoprazole (PROTONIX) IV  40 mg Intravenous Q24H  . [START ON 08/16/2013] pneumococcal 23 valent vaccine  0.5 mL Intramuscular Tomorrow-1000   Infusions:  . sodium chloride 1,000 mL (08/15/13 1000)  . heparin 1,300 Units/hr (08/15/13 0124)    Assessment: 58yo male [facility] when became SOB w/ chest discomfort and subsequently became unresponsive w/ PEA arrest, rapid ROSC w/ intubation and 2 rounds of CPR, concern for PE and NSTEMI with elevated troponin, to begin heparin gtt. Patient also has h/o afib but only on ASA PTA. CHADS-VASc low at 1 and HAS-BLED score of 3,  so likely ok to continue on ASA.  Of note, patient only has one peripheral IV so heparin gtt needs to be interrupted for antibiotic administration (cipro incompatible, cefepime variable). This am, HL on lower end of therapeutic at 0.34 so will increase slightly to maintain therapeutic especially since frequent interruptions necessary.   Goal of Therapy:  Heparin level 0.3-0.7 units/ml Monitor platelets by anticoagulation protocol:  Yes   Plan:  - Increase heparin gtt to 1350 units/hr - HL in 8 hours then daily  - Continue to monitor for s/s bleeding, LOT and long-term anticoag plans  Margie BilletErika K. von Vajna, PharmD Clinical Pharmacist - Resident Pager: [phone] Pharmacy: [phone] 08/15/2013 10:44 AM

## 2013-08-15 NOTE — Progress Notes (Signed)
ANTICOAGULATION CONSULT NOTE - Initial Consult  Pharmacy Consult for heparin Indication: r/o PE and NSTEMI  Allergies  Allergen Reactions  . Hectorol [Doxercalciferol] Other (See Comments)    Unknown ([facility])    Patient Measurements: Height: 5\' 5"  (165.1 cm) ([facility]) Weight: 201 lb 8 oz (91.4 kg) IBW/kg (Calculated) : 61.5 Heparin Dosing Weight: 85kg  Vital Signs: Temp: 98.8 F (37.1 C) (04/27 0022) Temp src: Oral (04/27 0022) BP: 73/54 mmHg (04/27 0030) Pulse Rate: 117 (04/27 0030)  Labs:  Recent Labs  07/29/2013 1530 07/24/2013 1541 07/26/2013 1659 08/02/2013 1740 08/13/2013 2310 07/31/2013 2325  HGB 10.7* 11.6*  --   --   --  10.1*  HCT 33.5* 34.0*  --   --   --  31.3*  PLT 157  --   --   --   --  124*  LABPROT 14.9  --   --   --   --  15.5*  INR 1.20  --   --   --   --  1.26  CREATININE 2.84* 2.90*  --  2.82*  --   --   TROPONINI  --   --  <0.30  --  1.06*  --     Estimated Creatinine Clearance: 29.3 ml/min (by C-G formula based on Cr of 2.82).   Medical History: Past Medical History  Diagnosis Date  . Pneumonia   . Pseudomonas infection   . Hip fracture   . Avascular necrosis of hip     lt HIP  . End stage renal failure on dialysis   . Constipation   . PAD (peripheral artery disease)   . Depression   . Hypotension   . Peptic ulcer disease   . A-fib   . Hyperlipidemia   . Anemia     Medications:  Prescriptions prior to admission  Medication Sig Dispense Refill  . aspirin EC 81 MG tablet Take 81 mg by mouth daily.      Marland Kitchen. B-Complex-C-Biotin-Minerals-FA (FOLBEE PLUS CZ PO) Take 1 tablet by mouth at bedtime.      . bisacodyl (DULCOLAX) 5 MG EC tablet Take 5 mg by mouth daily as needed (constipation).      . Cefepime HCl 2 GM/100ML SOLN Inject 1 g into the vein daily.      . cinacalcet (SENSIPAR) 60 MG tablet Take 60 mg by mouth 3 (three) times a week. Tuesday, Thursday and  Saturday      . ciprofloxacin (CIPRO IN D5W) 400 MG/200ML SOLN Inject 400 mg into the vein daily.      Marland Kitchen. docusate sodium (COLACE) 100 MG capsule Take 100 mg by mouth 2 (two) times daily.      . DULoxetine (CYMBALTA) 20 MG capsule Take 20 mg by mouth at bedtime.      . fentaNYL (DURAGESIC - DOSED MCG/HR) 25 MCG/HR patch Place 25 mcg onto the skin every 3 (three) days.      . folic acid (FOLVITE) 1 MG tablet Take 1 mg by mouth 2 (two) times daily.      Marland Kitchen. lactulose (CHRONULAC) 10 GM/15ML solution Take 20 g by mouth 3 (three) times daily.      Marland Kitchen. MAGNESIUM HYDROXIDE PO Take 30 mLs by mouth daily as needed (constipation). 8% oral suspension      . methocarbamol (ROBAXIN) 500 MG tablet Take 500 mg by mouth 3 (three) times daily.      . midodrine (PROAMATINE)  5 MG tablet Take 20 mg by mouth 3 (three) times daily with meals.      Marland Kitchen. omeprazole (PRILOSEC) 40 MG capsule Take 40 mg by mouth 2 (two) times daily.      Marland Kitchen. oxyCODONE-acetaminophen (PERCOCET) 10-325 MG per tablet Take 1 tablet by mouth every 6 (six) hours as needed for pain.      . pentoxifylline (TRENTAL) 400 MG CR tablet Take 400 mg by mouth every evening.      . polyethylene glycol (MIRALAX / GLYCOLAX) packet Take 17 g by mouth daily.      . predniSONE (DELTASONE) 5 MG tablet Take 5 mg by mouth 2 (two) times a week. On Tuesday Thursday and Saturday      . sevelamer carbonate (RENVELA) 800 MG tablet Take 2,400 mg by mouth 3 (three) times daily before meals.      . Vitamin D, Ergocalciferol, (DRISDOL) 50000 UNITS CAPS capsule Take 50,000 Units by mouth every 30 (thirty) days. On the 1st of each month       Scheduled:  . antiseptic oral rinse  15 mL Mouth Rinse QID  . ceFEPime (MAXIPIME) IV  1 g Intravenous Once  . chlorhexidine  15 mL Mouth Rinse BID  . ciprofloxacin  400 mg Intravenous Q24H  . heparin subcutaneous  5,000 Units Subcutaneous 3 times per day  . hydrocortisone sodium succinate  50 mg Intravenous Q6H  . insulin aspart  2-6 Units  Subcutaneous 6 times per day  . ipratropium-albuterol  3 mL Nebulization Q4H  . midodrine  20 mg Oral TID WC  . pantoprazole (PROTONIX) IV  40 mg Intravenous Q24H   Infusions:  . sodium chloride 1,000 mL (08/01/2013 2000)    Assessment: 58yo male [facility] when became SOB w/ chest discomfort and subsequently became unresponsive w/ PEA arrest, rapid ROSC w/ intubation and 2 rounds of CPR, concern for PE and NSTEMI, to begin heparin.  Goal of Therapy:  Heparin level 0.3-0.7 units/ml Monitor platelets by anticoagulation protocol: Yes   Plan:  Rec'd SQ heparin at 2300; will begin heparin gtt at 1300 units/hr and monitor heparin levels and CBC.  Vernard GamblesVeronda Dylan Monforte, PharmD, BCPS  08/15/2013,12:56 AM

## 2013-08-16 LAB — GLUCOSE, CAPILLARY
GLUCOSE-CAPILLARY: 93 mg/dL (ref 70–99)
Glucose-Capillary: 77 mg/dL (ref 70–99)
Glucose-Capillary: 77 mg/dL (ref 70–99)
Glucose-Capillary: 83 mg/dL (ref 70–99)
Glucose-Capillary: 83 mg/dL (ref 70–99)
Glucose-Capillary: 87 mg/dL (ref 70–99)

## 2013-08-16 LAB — MAGNESIUM: Magnesium: 2.3 mg/dL (ref 1.5–2.5)

## 2013-08-16 LAB — HEPATITIS B SURFACE ANTIBODY,QUALITATIVE

## 2013-08-16 LAB — LACTIC ACID, PLASMA: Lactic Acid, Venous: 1.9 mmol/L (ref 0.5–2.2)

## 2013-08-16 LAB — HEPATITIS B CORE ANTIBODY, TOTAL: Hep B Core Total Ab: NONREACTIVE

## 2013-08-16 LAB — CBC
HEMATOCRIT: 32.4 % — AB (ref 39.0–52.0)
Hemoglobin: 10.6 g/dL — ABNORMAL LOW (ref 13.0–17.0)
MCH: 34.4 pg — ABNORMAL HIGH (ref 26.0–34.0)
MCHC: 32.7 g/dL (ref 30.0–36.0)
MCV: 105.2 fL — AB (ref 78.0–100.0)
Platelets: 173 10*3/uL (ref 150–400)
RBC: 3.08 MIL/uL — ABNORMAL LOW (ref 4.22–5.81)
RDW: 16.9 % — ABNORMAL HIGH (ref 11.5–15.5)
WBC: 32 10*3/uL — ABNORMAL HIGH (ref 4.0–10.5)

## 2013-08-16 LAB — HEPATITIS B SURFACE ANTIGEN: Hepatitis B Surface Ag: NEGATIVE

## 2013-08-16 LAB — AMMONIA: Ammonia: 30 umol/L (ref 11–60)

## 2013-08-16 LAB — HEPARIN LEVEL (UNFRACTIONATED): Heparin Unfractionated: 0.53 IU/mL (ref 0.30–0.70)

## 2013-08-16 LAB — PHOSPHORUS: Phosphorus: 3.6 mg/dL (ref 2.3–4.6)

## 2013-08-16 MED ORDER — PENTAFLUOROPROP-TETRAFLUOROETH EX AERO: 1.0000 "application " | INHALATION_SPRAY | CUTANEOUS | Status: DC | PRN

## 2013-08-16 MED ORDER — LIDOCAINE-PRILOCAINE 2.5-2.5 % EX CREA
1.0000 "application " | TOPICAL_CREAM | CUTANEOUS | Status: DC | PRN
  Filled 2013-08-16: qty 5

## 2013-08-16 MED ORDER — MIDODRINE HCL 5 MG PO TABS
10.0000 mg | ORAL_TABLET | Freq: Once | ORAL | Status: AC
  Administered 2013-08-16: 10 mg via ORAL
  Filled 2013-08-16: qty 2

## 2013-08-16 MED ORDER — COLLAGENASE 250 UNIT/GM EX OINT
TOPICAL_OINTMENT | Freq: Every day | CUTANEOUS | Status: DC
  Administered 2013-08-16: 17:00:00 via TOPICAL
  Filled 2013-08-16: qty 30

## 2013-08-16 MED ORDER — LACTULOSE 10 GM/15ML PO SOLN
20.0000 g | Freq: Four times a day (QID) | ORAL | Status: DC
  Administered 2013-08-16 (×2): 20 g
  Filled 2013-08-16 (×6): qty 30

## 2013-08-16 MED ORDER — PRO-STAT SUGAR FREE PO LIQD
30.0000 mL | Freq: Four times a day (QID) | ORAL | Status: DC
  Administered 2013-08-16 (×3): 30 mL
  Filled 2013-08-16 (×7): qty 30

## 2013-08-16 MED ORDER — HEPARIN SODIUM (PORCINE) 1000 UNIT/ML DIALYSIS: 1000.0000 [IU] | INTRAMUSCULAR | Status: DC | PRN

## 2013-08-16 MED ORDER — ASPIRIN 325 MG PO TABS
325.0000 mg | ORAL_TABLET | Freq: Every day | ORAL | Status: DC
  Administered 2013-08-16: 325 mg
  Filled 2013-08-16 (×2): qty 1

## 2013-08-16 MED ORDER — MIDODRINE HCL 5 MG PO TABS
20.0000 mg | ORAL_TABLET | Freq: Three times a day (TID) | ORAL | Status: DC
  Administered 2013-08-16 – 2013-08-17 (×3): 20 mg
  Filled 2013-08-16 (×6): qty 4

## 2013-08-16 MED ORDER — NEPRO/CARBSTEADY PO LIQD
237.0000 mL | ORAL | Status: DC | PRN
  Filled 2013-08-16: qty 237

## 2013-08-16 MED ORDER — SODIUM CHLORIDE 0.9 % IV SOLN: 100.0000 mL | INTRAVENOUS | Status: DC | PRN

## 2013-08-16 MED ORDER — NEPRO/CARBSTEADY PO LIQD
1000.0000 mL | ORAL | Status: DC
  Administered 2013-08-16: 1000 mL
  Filled 2013-08-16 (×3): qty 1000

## 2013-08-16 MED ORDER — ALTEPLASE 2 MG IJ SOLR
2.0000 mg | Freq: Once | INTRAMUSCULAR | Status: AC | PRN
  Filled 2013-08-16: qty 2

## 2013-08-16 MED ORDER — LIDOCAINE HCL (PF) 1 % IJ SOLN: 5.0000 mL | INTRAMUSCULAR | Status: DC | PRN

## 2013-08-16 MED ORDER — LACTULOSE 10 GM/15ML PO SOLN
20.0000 g | Freq: Three times a day (TID) | ORAL | Status: DC
  Administered 2013-08-16: 20 g
  Filled 2013-08-16 (×3): qty 30

## 2013-08-16 NOTE — Consult Note (Signed)
Referring Provider: No ref. provider found Primary Care Physician:  Default, Provider, MD Primary Nephrologist:  none  Reason for Consultation:  Medical management of ESRD HPI: [id number] male who resides at SNF w/ ESRD [facility] and [facility]; after admission from 4/17 to 4/27 where he was treated for pneumonia, pseudomonas bacteremia, and severe left hip pain d/t avascular necrosis, and femoral neck fracture which [facility] and deemed not an operable candidate. He was felt stable to d/c to [facility] where he could get HD in the room and continue supportive care as well as pain management. While en route to [facility] he developed acute shortness of breath, chest discomfort and subsequently became unresponsive f/b PEA arrest. He was intubated, underwent 2 rounds of CPR, w/ rapid ROSC. In ED he was awake on vent. [facility]   Past Medical History  Diagnosis Date  . Pneumonia   . Pseudomonas infection   . Hip fracture   . Avascular necrosis of hip     lt HIP  . End stage renal failure on dialysis 1994    hd tuesday, thursday, saturday  . Constipation   . PAD (peripheral artery disease)   . Depression   . Hypotension     on midodrine  . Peptic ulcer disease   . A-fib   . Hyperlipidemia   . Anemia   . Foot amputation status     amputation of all toes on left foot, right foot   . A-V fistula     left arm  . Adrenal insufficiency     chronic predinsone use  . Swallowing difficulty   . Hyperparathyroidism, secondary     sensipar 3 times weekly  . Gastric ulcer   . Other specified hypoglycemia     due to poor po intake  . Sepsis due to Pseudomonas     [facility]  . 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 Pneumonia     left lower lobe  . PVD (peripheral vascular disease)   . Foot amputation status     Rt great  toe and toes 3&4 amputated  . Amputation finger     left middle finger amputated  . Amputated finger     Right fingers 2, 4 & 5 snipped and necrotic  . Cataract of both eyes   . Coronary artery disease   . Dysrhythmia   . Anxiety   . GERD (gastroesophageal reflux disease)   . Hepatitis     History reviewed. No pertinent past surgical history.  Prior to Admission medications   Medication Sig Start Date End Date Taking? Authorizing Provider  acetaminophen (TYLENOL) 325 MG tablet Take 650 mg by mouth every 4 (four) hours as needed for mild pain or fever.   Yes Historical Provider, MD  aspirin EC 81 MG tablet Take 81 mg by mouth daily.   Yes Historical Provider, MD  B-Complex-C-Biotin-Minerals-FA (FOLBEE PLUS CZ PO) Take 1 tablet by mouth at bedtime.   Yes Historical Provider, MD  bisacodyl (DULCOLAX) 5 MG EC tablet Take 10 mg by mouth daily as needed for moderate constipation.   Yes Historical Provider, MD  Cefepime HCl 2 GM/100ML SOLN Inject 1 g into the vein daily.   Yes Historical Provider, MD  cinacalcet (SENSIPAR) 60 MG tablet Take 60 mg by mouth 3 (three) times a week. Tuesday, Thursday  and Saturday   Yes Historical Provider, MD  ciprofloxacin (CIPRO IN D5W) 400 MG/200ML SOLN Inject 400 mg into the vein daily.   Yes Historical Provider, MD  docusate sodium (COLACE) 100 MG capsule Take 100 mg by mouth 2 (two) times daily.   Yes Historical Provider, MD  DULoxetine (CYMBALTA) 20 MG capsule Take 20 mg by mouth at bedtime.   Yes Historical Provider, MD  fentaNYL (DURAGESIC - DOSED MCG/HR) 25 MCG/HR patch Place 25 mcg onto the skin every 3 (three) days.   Yes Historical Provider, MD  folic acid (FOLVITE) 1 MG tablet Take 1 mg by mouth 2 (two) times daily.   Yes Historical Provider, MD  lactulose (CHRONULAC) 10 GM/15ML solution Take 20 g by mouth 3 (three) times daily.   Yes Historical Provider, MD  methocarbamol (ROBAXIN) 500 MG tablet Take 500 mg by mouth 3 (three) times daily.   Yes  Historical Provider, MD  midodrine (PROAMATINE) 5 MG tablet Take 20 mg by mouth 3 (three) times daily with meals.   Yes Historical Provider, MD  oxyCODONE (OXY IR/ROXICODONE) 5 MG immediate release tablet Take 10 mg by mouth every 4 (four) hours as needed for severe pain.   Yes Historical Provider, MD  oxymorphone (OPANA ER) 10 MG 12 hr tablet Take 20 mg by mouth every 12 (twelve) hours.   Yes Historical Provider, MD  pantoprazole (PROTONIX) 40 MG tablet Take 40 mg by mouth daily.   Yes Historical Provider, MD  pentoxifylline (TRENTAL) 400 MG CR tablet Take 400 mg by mouth every evening.   Yes Historical Provider, MD  polyethylene glycol (MIRALAX / GLYCOLAX) packet Take 17 g by mouth daily.   Yes Historical Provider, MD  predniSONE (DELTASONE) 5 MG tablet Take 5 mg by mouth 3 (three) times a week. On Tuesday, Thursday, Saturday   Yes Historical Provider, MD  sevelamer carbonate (RENVELA) 800 MG tablet Take 2,400 mg by mouth 3 (three) times daily before meals.   Yes Historical Provider, MD  Vitamin D, Ergocalciferol, (DRISDOL) 50000 UNITS CAPS capsule Take 50,000 Units by mouth every 30 (thirty) days. On the 1st of each month   Yes Historical Provider, MD  zolpidem (AMBIEN) 5 MG tablet Take 5 mg by mouth at bedtime as needed for sleep.   Yes Historical Provider, MD    Current Facility-Administered Medications  Medication Dose Route Frequency Provider Last Rate Last Dose  . 0.9 %  sodium chloride infusion  1,000 mL Intravenous Continuous Neema K Sharda, MD 10 mL/hr at 08/16/13 0700 1,000 mL at 08/16/13 0700  . 0.9 %  sodium chloride infusion  100 mL Intravenous PRN Garnetta Buddy, MD      . 0.9 %  sodium chloride infusion  100 mL Intravenous PRN Garnetta Buddy, MD      . alteplase (CATHFLO ACTIVASE) injection 2 mg  2 mg Intracatheter Once PRN Garnetta Buddy, MD      . antiseptic oral rinse (BIOTENE) solution 15 mL  15 mL Mouth Rinse QID Alyson Reedy, MD   15 mL at 08/16/13 0400  . aspirin tablet 325  mg  325 mg Per Tube Daily Anabel Bene, RPH   325 mg at 08/16/13 1046  . chlorhexidine (PERIDEX) 0.12 % solution 15 mL  15 mL Mouth Rinse BID Alyson Reedy, MD   15 mL at 08/16/13 0834  . feeding supplement (NEPRO CARB STEADY) liquid 237 mL  237 mL Oral PRN Garnetta Buddy, MD      .  fentaNYL (SUBLIMAZE) injection 100 mcg  100 mcg Intravenous Q2H PRN Alyson ReedyWesam G Yacoub, MD   50 mcg at 08/15/13 0949  . heparin ADULT infusion 100 units/mL (25000 units/250 mL)  1,350 Units/hr Intravenous Continuous Tyler Deisrika K von Vajna, RPH 13.5 mL/hr at 08/15/13 2024 1,350 Units/hr at 08/15/13 2024  . heparin injection 1,000 Units  1,000 Units Dialysis PRN Garnetta BuddyMartin W Hildred Pharo, MD      . hydrocortisone sodium succinate (SOLU-CORTEF) 100 MG injection 50 mg  50 mg Intravenous Q6H Alyson ReedyWesam G Yacoub, MD   50 mg at 08/16/13 0558  . insulin aspart (novoLOG) injection 2-6 Units  2-6 Units Subcutaneous 6 times per day Alyson ReedyWesam G Yacoub, MD      . ipratropium-albuterol (DUONEB) 0.5-2.5 (3) MG/3ML nebulizer solution 3 mL  3 mL Nebulization Q4H Alyson ReedyWesam G Yacoub, MD   3 mL at 08/16/13 0816  . lidocaine (PF) (XYLOCAINE) 1 % injection 5 mL  5 mL Intradermal PRN Garnetta BuddyMartin W Cristabel Bicknell, MD      . lidocaine-prilocaine (EMLA) cream 1 application  1 application Topical PRN Garnetta BuddyMartin W Ladashia Demarinis, MD      . midodrine (PROAMATINE) tablet 20 mg  20 mg Per Tube TID WC Neema Davina PokeK Sharda, MD      . pantoprazole (PROTONIX) injection 40 mg  40 mg Intravenous Q24H Alyson ReedyWesam G Yacoub, MD   40 mg at 08/15/13 2230  . pentafluoroprop-tetrafluoroeth (GEBAUERS) aerosol 1 application  1 application Topical PRN Garnetta BuddyMartin W Malea Swilling, MD      . phenylephrine (NEO-SYNEPHRINE) 40 mg in dextrose 5 % 250 mL infusion  30-200 mcg/min Intravenous Titrated Leslye Peerobert S Byrum, MD 11.3 mL/hr at 08/16/13 0600 30 mcg/min at 08/16/13 0600    Allergies as of 08/05/2013 - Review Complete 08/05/2013  Allergen Reaction Noted  . Hectorol [doxercalciferol] Other (See Comments) 08/07/2013    History reviewed. No pertinent family  history.  History   Social History  . Marital Status: Unknown    Spouse Name: N/A    Number of Children: N/A  . Years of Education: N/A   Occupational History  . Not on file.   Social History Main Topics  . Smoking status: Former Games developermoker  . Smokeless tobacco: Not on file  . Alcohol Use: No  . Drug Use: No  . Sexual Activity: No   Other Topics Concern  . Not on file   Social History Narrative  . No narrative on file    Review of Systems: Unable to provide  Physical Exam: Vital signs in last 24 hours: Temp:  [97.7 F (36.5 C)-98.7 F (37.1 C)] 97.7 F (36.5 C) (04/28 0847) Pulse Rate:  [83-114] 92 (04/28 1100) Resp:  [10-23] 18 (04/28 1100) BP: (66-99)/(36-78) 84/56 mmHg (04/28 1100) SpO2:  [88 %-100 %] 100 % (04/28 1100) FiO2 (%):  [3 %] 3 % (04/28 0400) Weight:  [92.4 kg (203 lb 11.3 oz)-92.8 kg (204 lb 9.4 oz)] 92.8 kg (204 lb 9.4 oz) (04/28 0847) Last BM Date: 08/15/13 General:   Chronically ill appearing Head:  Normocephalic and atraumatic. Eyes:  Sclera clear, no icterus.   Conjunctiva pink. Ears:  Normal auditory acuity. Nose:  No deformity, discharge,  or lesions. Mouth:  No deformity or lesions, dentition normal. Neck:  Supple; no masses or thyromegaly. JVP not elevated Lungs: Rhonchi diffuse Heart:  Irregular irregular;  Abdomen:  Soft mildly distended abdomen and hyporesponsive Msk:  Symmetrical without gross deformities. Normal posture. Pulses:  Faint throughout Extremities:  Without clubbing  Diffuse anasarca Neurologic:  Alert and  oriented x4;  grossly normal neurologically. Skin:  Diffuse anasarca. Multiple ulcerations and non-healing wounds. Most specifically right ring finger dry-gangrenous wound w/ bone exposure, sacral ulcer not stagable, right ant LE w/ non-stag able ulcer, multiple areas of ecchymosis Cervical Nodes:  No significant cervical adenopathy.   Intake/Output from previous day: 04/27 0701 - 04/28 0700 In: 1309.1 [I.V.:869.1;  NG/GT:190; IV Piggyback:250] Out: 350 [Emesis/NG output:350] Intake/Output this shift: Total I/O In: 129.4 [I.V.:104.4; NG/GT:25] Out: -   Lab Results:  Recent Labs  08/15/13 0330 08/15/13 1716 08/16/13 0236  WBC 23.0* 32.0* 32.0*  HGB 9.7* 10.6* 10.6*  HCT 30.1* 32.7* 32.4*  PLT 120* 179 173   BMET  Recent Labs  Aug 20, 2013 1530 Aug 20, 2013 1541 08-20-2013 1740 08/15/13 0330  NA 137 134* 134* 133*  K 3.4* 3.1* 4.1 3.4*  CL 93* 97 92* 92*  CO2 21  --  18* 25  GLUCOSE 124* 119* 101* 108*  BUN 27* 26* 27* 30*  CREATININE 2.84* 2.90* 2.82* 3.11*  CALCIUM 9.0  --  9.1 9.5  PHOS  --   --   --  3.3   LFT  Recent Labs  08/20/2013 1740  PROT 5.3*  ALBUMIN 1.6*  AST 64*  ALT 22  ALKPHOS 175*  BILITOT 1.3*   PT/INR  Recent Labs  20-Aug-2013 1530 Aug 20, 2013 2325  LABPROT 14.9 15.5*  INR 1.20 1.26   Hepatitis Panel No results found for this basename: HEPBSAG, HCVAB, HEPAIGM, HEPBIGM,  in the last 72 hours  Studies/Results: Dg Chest Port 1 View  08/15/2013   CLINICAL DATA:  Shortness of breath.  EXAM: PORTABLE CHEST - 1 VIEW  COMPARISON:  20-Aug-2013.  FINDINGS: Endotracheal tube is grossly unchanged in position with distal tip approximately 1.2 cm above the carina. No pneumothorax or significant pleural effusion is noted. Stable cardiomegaly is noted. Stable mild central pulmonary vascular congestion is noted. Minimal bilateral perihilar edema may be present. Degenerative changes are seen involving both shoulders.  IMPRESSION: Endotracheal tube in grossly good position. Stable mild central pulmonary vascular congestion and possible minimal bilateral perihilar edema.   Electronically Signed   By: Roque Lias M.D.   On: 08/15/2013 07:26   Dg Chest Portable 1 View  Aug 20, 2013   CLINICAL DATA:  Post CPR, intubated  EXAM: PORTABLE CHEST - 1 VIEW  COMPARISON:  None.  FINDINGS: The tip of the endotracheal tube is 1.8 cm above the carina. Inspiratory volumes are very low.  Cardiopericardial silhouette is enlarged likely representing cardiomegaly. Nonspecific right perihilar opacities may reflect atelectasis or infiltrate. No pneumothorax or pleural effusion. Advanced degenerative changes of the bilateral shoulder joints. No acute osseous abnormality.  IMPRESSION: 1. The tip of the endotracheal tube is 1.8 cm above the carina. 2. Low inspiratory volumes with the right mid lung and perihilar airspace opacity which could reflect atelectasis or infiltrate. 3. Cardiomegaly.   Electronically Signed   By: Malachy Moan M.D.   On: 08-20-2013 15:44   Dg Chest Port 1v Same Day  20-Aug-2013   CLINICAL DATA:  Postprocedure SCU film; SS positioning of support tubes and lines.  EXAM: PORTABLE CHEST - 1 VIEW SAME DAY  COMPARISON:  DG CHEST 1V PORT dated 08/20/2013  FINDINGS: The endotracheal tube tip still lies approximately 1.8 cm above the crotch of the carina. The lung volumes are low. The pulmonary interstitial markings have increased bilaterally since the study earlier today. The cardiopericardial silhouette remains enlarged. The pulmonary vascularity is more  engorged and indistinct. There is mild gaseous distention of the stomach or bowel in the left upper quadrant of the abdomen. No nasogastric tube is evident. There is no pleural effusion or pneumothorax.  IMPRESSION: 1. There is persistent bilateral pulmonary hypoinflation. The interstitial markings have increased bilaterally suggesting worsening interstitial edema. 2. The endotracheal tube tip lies approximately 1.8 cm above the crotch of the carina. Withdrawal by 1 cm would be useful to avoid accidental right mainstem bronchus intubation with patient movement. 3. There is mild gaseous distention of the stomach or bowel in the left upper quadrant of the abdomen.   Electronically Signed   By: David  [place]   On: 07/23/2013 17:52   Dg Abd Portable 1v  08/15/2013   CLINICAL DATA:  Nasogastric tube placement.  EXAM: PORTABLE ABDOMEN -  1 VIEW  COMPARISON:  Chest radiograph of same date  FINDINGS: Nasogastric tube terminates at the body of the stomach, looped within. Otherwise, moderately motion degraded exam. No gross free intraperitoneal air. Gas-filled small bowel loops which measure up to 3.0 cm, upper normal. Far right abdomen and low pelvis excluded.  IMPRESSION: Nasogastric tube looped in the stomach, with tip at gastric body.  Otherwise, motion degraded exam. Borderline small bowel dilatation, suggesting adynamic ileus versus partial small bowel obstruction.   Electronically Signed   By: Jeronimo Greaves M.D.   On: 08/15/2013 17:52    Assessment/Plan:  ESRD- TTS  . Dialysis in out patient unit in [place] in process of being [facility]  HTN/volume low BP is limiting fluid removal is requiring pressors to maintain blood pressure  Anemia stable  Acid/base stable  Electrolyte stable  Will need frank discussion with family about end of life issues - patient may not be able to tolerate dialysis   LOS: 2 Garnetta Buddy @TODAY @11 :03 AM

## 2013-08-16 NOTE — Progress Notes (Signed)
ANTICOAGULATION CONSULT NOTE - Initial Consult  Pharmacy Consult for heparin Indication: r/o PE and NSTEMI  Allergies  Allergen Reactions  . Hectorol [Doxercalciferol] Other (See Comments)    Unknown (listed on [facility] records)    Patient Measurements: Height: 5\' 5"  (165.1 cm) (from transfere papers from [facility]) Weight: 204 lb 9.4 oz (92.8 kg) IBW/kg (Calculated) : 61.5 Heparin Dosing Weight: 82kg  Vital Signs: Temp: 97.7 F (36.5 C) (04/28 0847) Temp src: Axillary (04/28 0847) BP: 87/52 mmHg (04/28 1015) Pulse Rate: 91 (04/28 1015)  Labs:  Recent Labs  07/26/2013 1530 08/10/2013 1541  08/04/2013 1740  08/18/2013 2325 08/15/13 0330 08/15/13 0845 08/15/13 0900 08/15/13 1400 08/15/13 1716 08/15/13 1852 08/15/13 2105 08/16/13 0236  HGB 10.7* 11.6*  --   --   --  10.1* 9.7*  --   --   --  10.6*  --   --  10.6*  HCT 33.5* 34.0*  --   --   --  31.3* 30.1*  --   --   --  32.7*  --   --  32.4*  PLT 157  --   --   --   --  124* 120*  --   --   --  179  --   --  173  LABPROT 14.9  --   --   --   --  15.5*  --   --   --   --   --   --   --   --   INR 1.20  --   --   --   --  1.26  --   --   --   --   --   --   --   --   HEPARINUNFRC  --   --   --   --   --   --   --  0.34  --   --   --  0.50  --  0.53  CREATININE 2.84* 2.90*  --  2.82*  --   --  3.11*  --   --   --   --   --   --   --   TROPONINI  --   --   < >  --   < >  --  1.29*  --  1.04* 0.83*  --   --  0.65*  --   < > = values in this interval not displayed.  Estimated Creatinine Clearance: 26.8 ml/min (by C-G formula based on Cr of 3.11).   Medical History: Past Medical History  Diagnosis Date  . Pneumonia   . Pseudomonas infection   . Hip fracture   . Avascular necrosis of hip     lt HIP  . End stage renal failure on dialysis 1994    hd tuesday, thursday, saturday  . Constipation   . PAD (peripheral artery disease)   . Depression   . Hypotension     on midodrine  . Peptic  ulcer disease   . A-fib   . Hyperlipidemia   . Anemia   . Foot amputation status     amputation of all toes on left foot, right foot   . A-V fistula     left arm  . Adrenal insufficiency     chronic predinsone use  . Swallowing difficulty   . Hyperparathyroidism, secondary     sensipar 3 times weekly  . Gastric ulcer   . Other specified  hypoglycemia     due to poor po intake  . Sepsis due to Pseudomonas     [facility]  . 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 Pneumonia     left lower lobe  . PVD (peripheral vascular disease)   . Foot amputation status     Rt great toe and toes 3&4 amputated  . Amputation finger     left middle finger amputated  . Amputated finger     Right fingers 2, 4 & 5 snipped and necrotic  . Cataract of both eyes   . Coronary artery disease   . Dysrhythmia   . Anxiety   . GERD (gastroesophageal reflux disease)   . Hepatitis     Medications:  Prescriptions prior to admission  Medication Sig Dispense Refill  . acetaminophen (TYLENOL) 325 MG tablet Take 650 mg by mouth every 4 (four) hours as needed for mild pain or fever.      Marland Kitchen. aspirin EC 81 MG tablet Take 81 mg by mouth daily.      Marland Kitchen. B-Complex-C-Biotin-Minerals-FA (FOLBEE PLUS CZ PO) Take 1 tablet by mouth at bedtime.      . bisacodyl (DULCOLAX) 5 MG EC tablet Take 10 mg by mouth daily as needed for moderate constipation.      . Cefepime HCl 2 GM/100ML SOLN Inject 1 g into the vein daily.      . cinacalcet (SENSIPAR) 60 MG tablet Take 60 mg by mouth 3 (three) times a week. Tuesday, Thursday and Saturday      . ciprofloxacin (CIPRO IN D5W) 400 MG/200ML SOLN Inject 400 mg into the vein daily.      Marland Kitchen. docusate sodium (COLACE) 100 MG capsule Take 100 mg by mouth 2 (two) times daily.      . DULoxetine (CYMBALTA) 20 MG capsule Take 20 mg by mouth at bedtime.      . fentaNYL (DURAGESIC - DOSED MCG/HR) 25 MCG/HR patch Place 25 mcg onto the skin  every 3 (three) days.      . folic acid (FOLVITE) 1 MG tablet Take 1 mg by mouth 2 (two) times daily.      Marland Kitchen. lactulose (CHRONULAC) 10 GM/15ML solution Take 20 g by mouth 3 (three) times daily.      . methocarbamol (ROBAXIN) 500 MG tablet Take 500 mg by mouth 3 (three) times daily.      . midodrine (PROAMATINE) 5 MG tablet Take 20 mg by mouth 3 (three) times daily with meals.      Marland Kitchen. oxyCODONE (OXY IR/ROXICODONE) 5 MG immediate release tablet Take 10 mg by mouth every 4 (four) hours as needed for severe pain.      Marland Kitchen. oxymorphone (OPANA ER) 10 MG 12 hr tablet Take 20 mg by mouth every 12 (twelve) hours.      . pantoprazole (PROTONIX) 40 MG tablet Take 40 mg by mouth daily.      . pentoxifylline (TRENTAL) 400 MG CR tablet Take 400 mg by mouth every evening.      . polyethylene glycol (MIRALAX / GLYCOLAX) packet Take 17 g by mouth daily.      . predniSONE (DELTASONE) 5 MG tablet Take 5 mg by mouth 3 (three) times a week. On Tuesday, Thursday, Saturday      . sevelamer carbonate (RENVELA) 800 MG tablet Take 2,400 mg by mouth 3 (three) times daily before meals.      . Vitamin D, Ergocalciferol, (  DRISDOL) 50000 UNITS CAPS capsule Take 50,000 Units by mouth every 30 (thirty) days. On the 1st of each month      . zolpidem (AMBIEN) 5 MG tablet Take 5 mg by mouth at bedtime as needed for sleep.       Scheduled:  . antiseptic oral rinse  15 mL Mouth Rinse QID  . aspirin  325 mg Per Tube Daily  . chlorhexidine  15 mL Mouth Rinse BID  . hydrocortisone sodium succinate  50 mg Intravenous Q6H  . insulin aspart  2-6 Units Subcutaneous 6 times per day  . ipratropium-albuterol  3 mL Nebulization Q4H  . midodrine  20 mg Per Tube TID WC  . pantoprazole (PROTONIX) IV  40 mg Intravenous Q24H  . pneumococcal 23 valent vaccine  0.5 mL Intramuscular Tomorrow-1000   Infusions:  . sodium chloride 1,000 mL (08/16/13 0700)  . heparin 1,350 Units/hr (08/15/13 2024)  . phenylephrine (NEO-SYNEPHRINE) Adult infusion 30  mcg/min (08/16/13 0600)    Assessment: 58yo male [facility] when became SOB w/ chest discomfort and subsequently became unresponsive w/ PEA arrest, rapid ROSC w/ intubation and 2 rounds of CPR, concern for PE and NSTEMI with elevated troponin, to begin heparin gtt. Patient also has h/o afib but only on ASA PTA. CHADS-VASc low at 1 and HAS-BLED score of 3, so likely ok to continue on ASA.   Heparin level this am remains therapeutic at 0.53. H/H remains low but stable (10.6/32.4), plt improved to wnl, no overt s/s bleeding noted.    Goal of Therapy:  Heparin level 0.3-0.7 units/ml Monitor platelets by anticoagulation protocol: Yes   Plan:  - Continue IV heparin at 1350 units/hr  - Daily HL, CBC, s/s bleeding - Likely will continue until tomorrow am, but will reassess in the morning  Margie BilletErika K. von Vajna, PharmD Clinical Pharmacist - Resident Pager: [phone] Pharmacy: [phone] 08/16/2013 10:27 AM

## 2013-08-16 NOTE — Consult Note (Signed)
WOC wound consult note Reason for Consult: evaluation of multiple skin lesions and sacrum.  Pt from outside hospital on transfer to [facility] when he arrested in route and was brought here to [facility]. [facility] one of which is certainly PAD, he has necrotic dry lesions on his BLE. Dry gangrenous changes in his fingertips on the right hand with exposed bone.  He has scabbed areas over the entirety of his BLE They are all mostly dry including the fingertips and toe tips.  He has had toes amputated on both LE.  He is currently on HD.  Wound type: He does have some open skin tear lesions on his LE as well as what appears to be healing Stage III Pressure Ulcers on his bilateral heels.  He also presents with unstageable Pressure Ulcer on the sacrum and a Stage II on the right lower buttock   Pressure Ulcer POA: Yes x 4 Measurement: Sacrum 4cm x 6cm x 0.2cm Right ishcium: 0.5cm 0.5cm x0.2cm  Right heel and left heel both aprox. 2.0cm x 2.0cm x 0.5cm  Skin tear right LE: 3.0cm x 3.5cm x 0.2cm  Wound bed: Sacrum: soft grey/yellow slough 100% Right ischium: 100% pink, clean Right and left heel: pale, pink, clean Skin tear right leg: clean, pink, moist  Drainage (amount, consistency, odor) minimal from all sites, serous, except the sacrum-moderate, serosanguinous  Periwound: intact at all sites Dressing procedure/placement/frequency: Silicone foam for protection of the heels, pt is moving around in the bed for that reason will not order Prevalon boots.  Will add enzymatic debridement ointment for the sacrum and once goals of care determined further may benefit from hydrotherapy.  Sport mattress in place for pressure redistribution. Foam for all partial thickness lesions on the legs and arms that are open and draining. No topical care for the necrotic, gangrenous toes and fingers.   WOC team will follow along with you for changes in wound care as needed.  Deshana Rominger [place]  RN,CWOCN [phone]

## 2013-08-16 NOTE — Progress Notes (Signed)
Pt has 1 PIV placed at previous hospital on 08/07/13 via ultrasound by an IV team RN. [facility] team at [facility] was unable to place another PIV since admit. Current PIV had blood return, but now is pulled out some and no longer has blood return. I attempted to advance the catheter but was unable. IV team paged to assess and Dr. Clyde LundborgNiu to bedside to discuss pt's access. At this time, will stop Neo and give dose of Midodrine. Will continue to monitor pt's BP closely and IV team RN will attempt another PIV.

## 2013-08-16 NOTE — Clinical Documentation Improvement (Signed)
PEA arrest while being transferred.   V/S's on admit: 73/54 - 117 - 20   T( 95.7 to 99.0)  WBC on admission was 24.3  Lactic Acid's serially run = 7.39, 6.1, 4.3  Fluid resuscitation; 3 rounds Epinephrine given by EMS  Neo Synephrine gtt hung  Multiple sources of infection and organ failure - dry gangrene right finger, non stagable sacral ulcer, pneumonia,   Cefepime and Cipro initiated  Please provide diagnosis associated with the above data and treatment provided.  Sepsis with Septic Shock Present on Admission Cardiogenic Shock Hypovolemic Shock Other Condition   Thank You, Shellee MiloEileen T Evadean Sproule ,RN Clinical Documentation Specialist:  [phone]  [facility]- Health Information Management

## 2013-08-16 NOTE — Progress Notes (Signed)
SLP Cancellation Note  Patient Details Name: Jacob DibbleRandy L Alderman MRN: [id number] DOB: 1955-12-29   Cancelled treatment:       Reason Eval/Treat Not Completed: Fatigue/lethargy limiting ability to participate Will continue to follow chart for readiness.   Aris Evertsmanda Laurice Riven Mabile 08/16/2013, 11:31 AM

## 2013-08-16 NOTE — Procedures (Signed)
I have seen and examined this patient and agree with the plan of care. Seen on dialysis  BP low 80/40 Garnetta BuddyMartin W Chon Buhl 08/16/2013, 11:12 AM

## 2013-08-16 NOTE — Progress Notes (Signed)
PULMONARY / CRITICAL CARE MEDICINE   Name: Jacob DibbleRandy L Nicholson MRN: [id number] DOB: [birth year]    ADMISSION DATE:  Jan 04, 2014 CONSULTATION DATE:  4/26   REFERRING MD :  Oletta LamasGhim  PRIMARY SERVICE: PCCM   CHIEF COMPLAINT:  Respiratory arrest   BRIEF PATIENT DESCRIPTION:  [id number] male who resides at SNF w/ ESRD [facility] and [facility] being [facility];  after admission from 4/17 to 4/27 where he was treated for pneumonia, pseudomonas bacteremia, and severe left hip pain d/t avascular necrosis, and femoral neck fracture which when [facility] was returned back and deemed not an operable candidate. He was felt stable to d/c to [facility] where he could get HD in the room and continue supportive care as well as pain management. While en route to [facility] he developed acute shortness of breath, chest discomfort and subsequently became unresponsive f/b PEA arrest. He was intubated, underwent 2 rounds of CPR, w/ rapid ROSC. In ED he was awake on vent. Followed some commands. PCCM asked to admit.   SIGNIFICANT EVENTS / STUDIES:  Echo 4/26>>> 65-70%, LA mod dilated  LINES / TUBES: OETT 4/26>>>  CULTURES: bc x2 4/26>>> Resp cx 4/27 >>>  ANTIBIOTICS: cipro 4/26>>> Cefepime 4/26>>>   SUBJECTIVE:  IV access issues overnight (lost one, gained another) - in the interim, required an extra dose of midodrine per tube to maintain BP, remains lethargic  VITAL SIGNS: Temp:  [98.2 F (36.8 C)-98.7 F (37.1 C)] 98.4 F (36.9 C) (04/28 0354) Pulse Rate:  [86-125] 90 (04/28 0730) Resp:  [10-23] 21 (04/28 0730) BP: (66-103)/(36-78) 99/78 mmHg (04/28 0730) SpO2:  [88 %-100 %] 100 % (04/28 0730) FiO2 (%):  [3 %] 3 % (04/28 0400) Weight:  [203 lb 11.3 oz (92.4 kg)] 203 lb 11.3 oz (92.4 kg) (04/28 0354) HEMODYNAMICS:   VENTILATOR SETTINGS: Vent Mode:  [-]  FiO2 (%):  [3 %] 3 % INTAKE / OUTPUT: Intake/Output     04/27 0701 - 04/28 0700 04/28 0701 - 04/29 0700    I.V. (mL/kg) 868.1 (9.4)    NG/GT 190    IV Piggyback 250    Total Intake(mL/kg) 1308.1 (14.2)    Emesis/NG output 350    Stool     Total Output 350     Net +958.1            PHYSICAL EXAMINATION: General:  Chronically ill appearing white male, no acute distress  Neuro: sedated but opens eyes to name, oriented to person, place (hospital), birthdate HEENT:  Poor dentition Cardiovascular:  irreg irreg, rate controlled Lungs:  Diffuse rhonchi  Abdomen:  Distended, soft, + bowel sounds  Musculoskeletal:  Generalized weakness Skin: Multiple ulcerations and non-healing wounds. Most specifically right ring finger dry-gangrenous wound w/ bone exposure, sacral ulcer not stagable, right ant LE w/ non-stag able ulcer, multiple areas of ecchymosis  LABS:  CBC  Recent Labs Lab 08/15/13 0330 08/15/13 1716 08/16/13 0236  WBC 23.0* 32.0* 32.0*  HGB 9.7* 10.6* 10.6*  HCT 30.1* 32.7* 32.4*  PLT 120* 179 173   Coag's  Recent Labs Lab 2013/07/28 1530 2013/07/28 2325  INR 1.20 1.26   BMET  Recent Labs Lab 2013/07/28 1530 2013/07/28 1541 2013/07/28 1740 08/15/13 0330  NA 137 134* 134* 133*  K 3.4* 3.1* 4.1 3.4*  CL 93* 97 92* 92*  CO2 21  --  18* 25  BUN 27* 26* 27* 30*  CREATININE 2.84* 2.90* 2.82* 3.11*  GLUCOSE 124* 119*  101* 108*   Electrolytes  Recent Labs Lab 07/28/2013 1530 08/04/2013 1740 08/15/13 0330  CALCIUM 9.0 9.1 9.5  MG 2.5  --  2.3  PHOS  --   --  3.3   Sepsis Markers  Recent Labs Lab 07/24/2013 1740 08/15/13 0330 08/16/13 0236  LATICACIDVEN 6.1* 4.3* 1.9   ABG  Recent Labs Lab 08/18/2013 1536 08/15/13 0402  PHART 7.370 7.563*  PCO2ART 45.6* 29.6*  PO2ART 238.0* 66.0*   Liver Enzymes  Recent Labs Lab 08/13/2013 1530 07/30/2013 1740  AST 40* 64*  ALT 20 22  ALKPHOS 182* 175*  BILITOT 1.1 1.3*  ALBUMIN 1.7* 1.6*   Cardiac Enzymes  Recent Labs Lab 08/16/2013 1530 07/21/2013 1659  08/15/13 0900 08/15/13 1400 08/15/13 2105  TROPONINI  --   <0.30  < > 1.04* 0.83* 0.65*  PROBNP 7638.0* 7668.0*  --   --   --   --   < > = values in this interval not displayed. Glucose  Recent Labs Lab 08/15/13 0754 08/15/13 1115 08/15/13 1528 08/15/13 1905 08/15/13 2337 08/16/13 0353  GLUCAP 101* 85 101* 101* 83 93    Imaging Dg Chest Port 1 View  08/15/2013   CLINICAL DATA:  Shortness of breath.  EXAM: PORTABLE CHEST - 1 VIEW  COMPARISON:  August 14, 2013.  FINDINGS: Endotracheal tube is grossly unchanged in position with distal tip approximately 1.2 cm above the carina. No pneumothorax or significant pleural effusion is noted. Stable cardiomegaly is noted. Stable mild central pulmonary vascular congestion is noted. Minimal bilateral perihilar edema may be present. Degenerative changes are seen involving both shoulders.  IMPRESSION: Endotracheal tube in grossly good position. Stable mild central pulmonary vascular congestion and possible minimal bilateral perihilar edema.   Electronically Signed   By: Roque LiasJames  Green M.D.   On: 08/15/2013 07:26   Dg Chest Portable 1 View  08/08/2013   CLINICAL DATA:  Post CPR, intubated  EXAM: PORTABLE CHEST - 1 VIEW  COMPARISON:  None.  FINDINGS: The tip of the endotracheal tube is 1.8 cm above the carina. Inspiratory volumes are very low. Cardiopericardial silhouette is enlarged likely representing cardiomegaly. Nonspecific right perihilar opacities may reflect atelectasis or infiltrate. No pneumothorax or pleural effusion. Advanced degenerative changes of the bilateral shoulder joints. No acute osseous abnormality.  IMPRESSION: 1. The tip of the endotracheal tube is 1.8 cm above the carina. 2. Low inspiratory volumes with the right mid lung and perihilar airspace opacity which could reflect atelectasis or infiltrate. 3. Cardiomegaly.   Electronically Signed   By: Malachy MoanHeath  McCullough M.D.   On: 08/06/2013 15:44   Dg Chest Port 1v Same Day  07/31/2013   CLINICAL DATA:  Postprocedure SCU film; SS positioning of support  tubes and lines.  EXAM: PORTABLE CHEST - 1 VIEW SAME DAY  COMPARISON:  DG CHEST 1V PORT dated 07/31/2013  FINDINGS: The endotracheal tube tip still lies approximately 1.8 cm above the crotch of the carina. The lung volumes are low. The pulmonary interstitial markings have increased bilaterally since the study earlier today. The cardiopericardial silhouette remains enlarged. The pulmonary vascularity is more engorged and indistinct. There is mild gaseous distention of the stomach or bowel in the left upper quadrant of the abdomen. No nasogastric tube is evident. There is no pleural effusion or pneumothorax.  IMPRESSION: 1. There is persistent bilateral pulmonary hypoinflation. The interstitial markings have increased bilaterally suggesting worsening interstitial edema. 2. The endotracheal tube tip lies approximately 1.8 cm above the crotch of the carina.  Withdrawal by 1 cm would be useful to avoid accidental right mainstem bronchus intubation with patient movement. 3. There is mild gaseous distention of the stomach or bowel in the left upper quadrant of the abdomen.   Electronically Signed   By: David  [place]   On: 07/31/2013 17:52   Dg Abd Portable 1v  08/15/2013   CLINICAL DATA:  Nasogastric tube placement.  EXAM: PORTABLE ABDOMEN - 1 VIEW  COMPARISON:  Chest radiograph of same date  FINDINGS: Nasogastric tube terminates at the body of the stomach, looped within. Otherwise, moderately motion degraded exam. No gross free intraperitoneal air. Gas-filled small bowel loops which measure up to 3.0 cm, upper normal. Far right abdomen and low pelvis excluded.  IMPRESSION: Nasogastric tube looped in the stomach, with tip at gastric body.  Otherwise, motion degraded exam. Borderline small bowel dilatation, suggesting adynamic ileus versus partial small bowel obstruction.   Electronically Signed   By: Jeronimo Greaves M.D.   On: 08/15/2013 17:52    ASSESSMENT / PLAN:  PULMONARY A: Acute Respiratory Failure, extubated       Cardiopulmonary arrest 4/26     ?HCAP - RML infiltrate (CXR read from OSH suggests prior PNA was lingular infiltrate with left basilar consolidation) P:   - Duonebs q4h - See ID section   CARDIOVASCULAR A:  PEA arrest  NSTEMI, CE downtrending Atrial fib - not on anticoag (?d/t h/o PUD with ulcer?) Chronic hypotension on Midodrine  H/o PVD - on Trental P:  - Accept SBP of >80 - Heparin gtt for at least until 4/29 AM, then reassess  - midodrine TID per tube, increase to 20mg  on 4/28 per his home dose - ASA suppository  - remains on low dose phenylephrine  RENAL A:   ESRD (TTS, last HD Sat 4/25) Lactic acidosis  Mild hypokalemia  P:   - Appreciate renal input, will try to dialyze this AM but soft pressures will be limiting. He is on phenylephrine and anticipate that he will not be able to complete HD. This may ultimately be the factor that compels [place] to change his code status and goals for care.   GASTROINTESTINAL A:  Protein cal malnutrition  On Lactulose at home - constipation + cirrhosis (?cryptogenic per chart review) with mod ascites on CT at OSH P:   - PPI for SUP - Nutrition consult today depending on GOC - patient with NGT (too lethargic to participate with SLP yesterday) - will start TF on 4/28  HEMATOLOGIC A:   Macrocytic Anemia, stable Thrombocytopenia, no obvious s/s of bleeding, resolved VTE ppx P:  - Heparin gtt to stop 4/29 - Trend CBC   INFECTIOUS A:   Leukocytosis Recent pneumonia, but concern for new HCAP vs Aspiration (CXR with RML infiltrate) Recent pseudomonas bacteremia (apparently source was non-healing gangrenous right ring finger wound). Most recent blood cultures were negative   Leukocytosis + tachycardia + RR and hypotension suggests septic shock in this chronically ill patient P:   - Follow blood & resp cx - Empiric GNR coverage, if becomes febrile, consider addition of vanc - Wound consult  ENDOCRINE A:  DM Chronic AI (prednisone  5mg  three times/week at home) P:   - SSI - Hydrocortisone q6h (stress)  NEUROLOGIC A:  Acute encephalopathy       Chronic hip pain  P:   - Supportive care  MSK A: Avascular necrosis of left femoral head Left hip fracture  Se[facility] sent back to [place] as  deemed NOT A SURGICAL CANDIDATE.  P: - Fentanyl prn  GLOBAL:  Plan to further discuss GOC with son today.  Stacy Gardner, MD (IMTS, PGY3) 08/16/2013, 8:02 AM  CC time 40 minutes  Levy Pupa, MD, PhD 08/16/2013, 9:56 AM [facility] [phone] or if no answer [phone]

## 2013-08-16 NOTE — Progress Notes (Signed)
NUTRITION CONSULT / FOLLOW UP  Intervention:    Utilize 75M PEPuP Protocol: initiate TF via NGT with Nepro at 25 ml/h and Prostat 30 ml QID on day 1; on day 2, increase to goal rate of 35 ml/h (840 ml per day) to provide 1912 kcals, 128 gm protein, 611 ml free water daily.  Nutrition Dx:   Increased nutrient needs related to wound healing as evidenced by multiple wounds on chest, fingers, and leg. Ongoing.  Goal:   Intake to meet >90% of estimated nutrition needs.  Monitor:   TF tolerance/adequacy, weight trend, labs, ability to begin PO diet.  Assessment:   Patient is a [age] male who resides at SNF w/ ESRD [facility] and was being transported to [facility];While en route to [facility] he developed acute shortness of breath, chest discomfort and subsequently became unresponsive f/b PEA arrest. He was intubated (4/26), underwent 2 rounds of CPR, w/ rapid ROSC.   Pt extubated 4/27. SLP unable to complete swallow evaluation due to lethargy. Received MD Consult for TF initiation and management. NGT is in place.  Height: Ht Readings from Last 1 Encounters:  Sep 15, 2013 5\' 5"  (1.651 m)    Weight Status:   Wt Readings from Last 1 Encounters:  08/16/13 204 lb 9.4 oz (92.8 kg)  08/15/13  200 lb 13.4 oz (91.1 kg)   Re-estimated needs:  Kcal: 1900 - 2100  Protein: 125 - 140 g  Fluid: 1.9- 2.1 L/ day  Skin:  Abrasion right side of chest wall  Necrosed tissue right index finger  Necrosed tissue to right ring finger (bone exposed)  Open wound to anterior lower right leg   Diet Order: NPO   Intake/Output Summary (Last 24 hours) at 08/16/13 1305 Last data filed at 08/16/13 1227  Gross per 24 hour  Intake 1102.5 ml  Output    850 ml  Net  252.5 ml    Last BM: 4/27   Labs:   Recent Labs Lab Sep 15, 2013 1530 Sep 15, 2013 1541 Sep 15, 2013 1740 08/15/13 0330  NA 137 134* 134* 133*  K 3.4* 3.1* 4.1 3.4*  CL 93* 97 92* 92*  CO2 21  --  18* 25  BUN  27* 26* 27* 30*  CREATININE 2.84* 2.90* 2.82* 3.11*  CALCIUM 9.0  --  9.1 9.5  MG 2.5  --   --  2.3  PHOS  --   --   --  3.3  GLUCOSE 124* 119* 101* 108*    CBG (last 3)   Recent Labs  08/16/13 0353 08/16/13 0800 08/16/13 1139  GLUCAP 93 87 77    Scheduled Meds: . antiseptic oral rinse  15 mL Mouth Rinse QID  . aspirin  325 mg Per Tube Daily  . chlorhexidine  15 mL Mouth Rinse BID  . collagenase   Topical Daily  . hydrocortisone sodium succinate  50 mg Intravenous Q6H  . insulin aspart  2-6 Units Subcutaneous 6 times per day  . ipratropium-albuterol  3 mL Nebulization Q4H  . lactulose  20 g Per Tube TID  . midodrine  20 mg Per Tube TID WC  . pantoprazole (PROTONIX) IV  40 mg Intravenous Q24H    Continuous Infusions: . sodium chloride 1,000 mL (08/16/13 0700)  . heparin 1,350 Units/hr (08/16/13 1200)  . phenylephrine (NEO-SYNEPHRINE) Adult infusion 30 mcg/min (08/16/13 0600)    Joaquin CourtsKimberly Eri Platten, RD, LDN, CNSC Pager [phone] After Hours Pager [phone]

## 2013-08-17 ENCOUNTER — Inpatient Hospital Stay (HOSPITAL_COMMUNITY): Payer: Medicare Other

## 2013-08-17 LAB — CULTURE, RESPIRATORY W GRAM STAIN: Special Requests: NORMAL

## 2013-08-17 LAB — COMPREHENSIVE METABOLIC PANEL
ALT: 27 U/L (ref 0–53)
AST: 54 U/L — ABNORMAL HIGH (ref 0–37)
Albumin: 2.1 g/dL — ABNORMAL LOW (ref 3.5–5.2)
Alkaline Phosphatase: 160 U/L — ABNORMAL HIGH (ref 39–117)
BUN: 32 mg/dL — AB (ref 6–23)
CO2: 27 meq/L (ref 19–32)
Calcium: 9.3 mg/dL (ref 8.4–10.5)
Chloride: 100 mEq/L (ref 96–112)
Creatinine, Ser: 2.82 mg/dL — ABNORMAL HIGH (ref 0.50–1.35)
GFR calc Af Amer: 27 mL/min — ABNORMAL LOW (ref >90)
GFR, EST NON AFRICAN AMERICAN: 23 mL/min — AB (ref >90)
Glucose, Bld: 94 mg/dL (ref 70–99)
POTASSIUM: 3.3 meq/L — AB (ref 3.7–5.3)
SODIUM: 142 meq/L (ref 137–147)
Total Bilirubin: 1.3 mg/dL — ABNORMAL HIGH (ref 0.3–1.2)
Total Protein: 5.9 g/dL — ABNORMAL LOW (ref 6.0–8.3)

## 2013-08-17 LAB — GLUCOSE, CAPILLARY
Glucose-Capillary: 126 mg/dL — ABNORMAL HIGH (ref 70–99)
Glucose-Capillary: 86 mg/dL (ref 70–99)
Glucose-Capillary: 98 mg/dL (ref 70–99)

## 2013-08-17 LAB — MAGNESIUM: Magnesium: 2.5 mg/dL (ref 1.5–2.5)

## 2013-08-17 LAB — CBC
HCT: 29.4 % — ABNORMAL LOW (ref 39.0–52.0)
Hemoglobin: 9.5 g/dL — ABNORMAL LOW (ref 13.0–17.0)
MCH: 34.3 pg — ABNORMAL HIGH (ref 26.0–34.0)
MCHC: 32.3 g/dL (ref 30.0–36.0)
MCV: 106.1 fL — ABNORMAL HIGH (ref 78.0–100.0)
PLATELETS: 107 10*3/uL — AB (ref 150–400)
RBC: 2.77 MIL/uL — ABNORMAL LOW (ref 4.22–5.81)
RDW: 17.4 % — ABNORMAL HIGH (ref 11.5–15.5)
WBC: 17.5 10*3/uL — ABNORMAL HIGH (ref 4.0–10.5)

## 2013-08-17 LAB — PHOSPHORUS: PHOSPHORUS: 4.2 mg/dL (ref 2.3–4.6)

## 2013-08-17 LAB — HEPARIN LEVEL (UNFRACTIONATED): Heparin Unfractionated: 0.35 IU/mL (ref 0.30–0.70)

## 2013-08-17 MED ORDER — MORPHINE SULFATE 2 MG/ML IJ SOLN
2.0000 mg | INTRAMUSCULAR | Status: DC | PRN
  Administered 2013-08-17 (×2): 2 mg via INTRAVENOUS
  Filled 2013-08-17 (×2): qty 1

## 2013-08-17 MED ORDER — POTASSIUM CHLORIDE 10 MEQ/100ML IV SOLN
10.0000 meq | INTRAVENOUS | Status: DC
  Administered 2013-08-17 (×3): 10 meq via INTRAVENOUS
  Filled 2013-08-17 (×3): qty 100

## 2013-08-17 MED ORDER — HEPARIN SODIUM (PORCINE) 5000 UNIT/ML IJ SOLN
5000.0000 [IU] | Freq: Three times a day (TID) | INTRAMUSCULAR | Status: DC
Start: 2013-08-17 — End: 2013-08-17
  Administered 2013-08-17: 5000 [IU] via SUBCUTANEOUS
  Filled 2013-08-17 (×4): qty 1

## 2013-08-19 NOTE — Progress Notes (Signed)
Brief progress note  Patient's respiratory condition is severely worsening. Frequent PVCs are noticed on telemetry. I predict that the patient will soon need to be resuscitated. I called patient's son and sister, and explained patient's current condition.  Bother his sister and his son, Mr. Jon BillingsMorrison are in agreement that no CPR and intubation should be performed from now.  DNR was ordered. Will treat with IV morphine for comfort care.   Lorretta HarpXilin Stillman Buenger, MD PGY3, Internal Medicine Teaching Service Pager: [phone]

## 2013-08-19 NOTE — Progress Notes (Signed)
At 0800 pt was using accessory muscles to breathe. Pt was responsive to stimulation. Sats were upper 90's and NRB was on. At 0815 RN was notified by E-Link that the patient's HR was dropping. Upon arrival to the room, the HR was in the 70's. Pt had an irregular respiratory pattern. RN Paticia Stackyesha and Trinna PostAlex as well as Dr Everardo BealsSharda were at the pt's bedside until he went asystole/PEA. At [phone] death was confirmed by RN Paticia Stackyesha and RN Trinna PostAlex. Dr. Delton CoombesByrum was notified as well. No spontaneous breath sounds and no heart beat was auscultated. Pupils 4 fixed and non reactive to light. Son Harvie HeckRandy and sister were notified. There was no funeral home arrangements made at the time. Pt had no valuables at the bedside. The family stated that they would not be coming to the hospital to view the body.

## 2013-08-19 NOTE — Discharge Summary (Signed)
PCCM Hospital Death Note  Name: Jacob DibbleRandy L Nicholson MRN: [id number] DOB: 10-Jul-1955 58 y.o.  Date of Admission: 08/02/2013  3:01 PM Date of Discharge: 08/09/2013 Attending Physician: Alyson ReedyWesam G Yacoub, MD  Discharge Diagnosis: Active Problems:   VDRF d/t PEA arrest, ETT 4/26-4/27   NSTEMI   ESRD (end stage renal disease)   Atrial fibrillation   Anemia   PVD (peripheral vascular disease)   Cardiogenic shock   Altered mental status   Cirrhosis  Cause of death: NSTEMI Time of death: 0834  Disposition and follow-up:   Jacob Nicholson [facility] expired condition.    Hospital Course: [id number] male with history as outlined above [facility] with respiratory failure in setting of PEA arrest s/p CPR x2, possibly in setting of narcotic overdose while in transit [facility] [place] [facility].  Found to have NSTEMI, and was treated with ASA/heparin gtt x 48h.  He was successfully extubated on 08/15/13, but continued to require pressor support and remained in AMS. He underwent HD x 1 on 08/16/13.    Given his critical, chronic illness, family meeting (attended by his son, brother, sister in law and sister) held on 4/28.  At that time, we discussed his limitations on IHD given his hypotension on pressure support and that he would not likely tolerate CPR again.  Later that evening, also updated daughter in law who is in nursing school - at that time she noted that patient's son decided against CPR.  On the morning of 07/29/2013, Dr. Clyde LundborgNiu spoke with son, who confirmed DNR/DNI status.    On 08/16/13 overnight & into the next morning, patient's HR increased to 130 and phenylephrine was discontinued.  Given that GOC were further clarified, pressors were not restarted as patient's BP & HR declined.    Patient became agonal and was treated with morphine, and eventual PEA arrest.   Signed: Belia Hemaneema K Sharda 07/28/2013, 9:08 AM  Levy Pupaobert Ryen Rhames, MD,  PhD 08/18/2013, 12:13 PM [facility] [phone] or if no answer [phone]

## 2013-08-19 NOTE — Progress Notes (Signed)
PULMONARY / CRITICAL CARE MEDICINE   Name: Jacob DibbleRandy L Nicholson MRN: [id number] DOB: May 09, 1955    ADMISSION DATE:  07/21/2013 CONSULTATION DATE:  4/26   REFERRING MD :  Oletta LamasGhim  PRIMARY SERVICE: PCCM   CHIEF COMPLAINT:  Respiratory arrest   BRIEF PATIENT DESCRIPTION:  [id number] male who w/ ESRD [facility] and was being [facility];  after admission from 4/17 to 4/27 where he was treated for pneumonia, pseudomonas bacteremia, and severe left hip pain d/t avascular necrosis, and femoral neck fracture which [facility] was [facility] and deemed not an operable candidate. He was felt stable to d/c to [facility] where he could get supine HD and continue supportive care as well as pain management. While en route to [facility] he developed acute SOB, chest discomfort and subsequently became unresponsive f/b PEA arrest. He was intubated, underwent 2 rounds of CPR, w/ rapid ROSC. In ED he was awake on vent. Followed some commands. PCCM asked to admit. To note, prior to [facility], [place]  SIGNIFICANT EVENTS / STUDIES:  Echo 4/26>>> 65-70%, LA mod dilated  LINES / TUBES: OETT 4/26>>> 4/27 NGT 4/27 >>>  CULTURES: bc x2 4/26>>> Resp cx 4/27 >>>  ANTIBIOTICS: cipro 4/26>>> 4/28 Cefepime 4/26>>> 4/28   SUBJECTIVE:  Declined overnight with increased work of breathing, CCM Resident, Dr. Clyde LundborgNiu, updated family, who were agreeable to DNR/DNI status.  Pressors off. Afebrile.  VITAL SIGNS: Temp:  [97.5 F (36.4 C)-98.6 F (37 C)] 98.2 F (36.8 C) (04/29 0400) Pulse Rate:  [54-123] 54 (04/29 0600) Resp:  [11-29] 24 (04/29 0600) BP: (75-124)/(48-86) 105/62 mmHg (04/29 0600) SpO2:  [77 %-100 %] 87 % (04/29 0600) Weight:  [204 lb 9.4 oz (92.8 kg)-206 lb 2.1 oz (93.5 kg)] 206 lb 2.1 oz (93.5 kg) (04/29 0500) HEMODYNAMICS:   VENTILATOR SETTINGS:   INTAKE / OUTPUT: Intake/Output     04/28 0701 - 04/29 0700 04/29  0701 - 04/30 0700   I.V. (mL/kg) 680 (7.3)    NG/GT 335.2    IV Piggyback 100    Total Intake(mL/kg) 1115.2 (11.9)    Emesis/NG output 150    Other 500    Total Output 650     Net +465.2            PHYSICAL EXAMINATION: General:  Chronically ill appearing white male, no acute distress  Neuro: sedated but opens eyes to name, oriented to person, but difficult to understand anything else HEENT:  Poor dentition Cardiovascular:  irreg irreg, bradycardic Lungs:  Diffuse rhonchi with increased work of breathing Abdomen:  Distended, soft, + bowel sounds  Musculoskeletal:  Generalized weakness Skin: Multiple ulcerations and non-healing wounds. Most specifically right ring finger dry-gangrenous wound w/ bone exposure, sacral ulcer not stagable, right ant LE w/ non-stag able ulcer, multiple areas of ecchymosis  LABS:  CBC  Recent Labs Lab 08/15/13 1716 08/16/13 0236 07/22/2013 0134  WBC 32.0* 32.0* 17.5*  HGB 10.6* 10.6* 9.5*  HCT 32.7* 32.4* 29.4*  PLT 179 173 107*   Coag's  Recent Labs Lab 07/26/2013 1530 08/16/2013 2325  INR 1.20 1.26   BMET  Recent Labs Lab 08/13/2013 1740 08/15/13 0330 07/30/2013 0134  NA 134* 133* 142  K 4.1 3.4* 3.3*  CL 92* 92* 100  CO2 18* 25 27  BUN 27* 30* 32*  CREATININE 2.82* 3.11* 2.82*  GLUCOSE 101* 108* 94   Electrolytes  Recent Labs Lab  08/02/2013 1740 08/15/13 0330 08/16/13 1705 2013-06-27 0134  CALCIUM 9.1 9.5  --  9.3  MG  --  2.3 2.3 2.5  PHOS  --  3.3 3.6 4.2   Sepsis Markers  Recent Labs Lab 08/16/2013 1740 08/15/13 0330 08/16/13 0236  LATICACIDVEN 6.1* 4.3* 1.9   ABG  Recent Labs Lab 07/26/2013 1536 08/15/13 0402  PHART 7.370 7.563*  PCO2ART 45.6* 29.6*  PO2ART 238.0* 66.0*   Liver Enzymes  Recent Labs Lab 07/22/2013 1530 08/18/2013 1740 2013-06-27 0134  AST 40* 64* 54*  ALT 20 22 27   ALKPHOS 182* 175* 160*  BILITOT 1.1 1.3* 1.3*  ALBUMIN 1.7* 1.6* 2.1*   Cardiac Enzymes  Recent Labs Lab 07/26/2013 1530  08/16/2013 1659  08/15/13 0900 08/15/13 1400 08/15/13 2105  TROPONINI  --  <0.30  < > 1.04* 0.83* 0.65*  PROBNP 7638.0* 7668.0*  --   --   --   --   < > = values in this interval not displayed. Glucose  Recent Labs Lab 08/16/13 0800 08/16/13 1139 08/16/13 1537 08/16/13 1935 08/16/13 2332 2013-06-27 0400  GLUCAP 87 77 83 77 86 98    Imaging Dg Abd Portable 1v  2013-05-21   CLINICAL DATA:  Nasogastric tube placement.  EXAM: PORTABLE ABDOMEN - 1 VIEW  COMPARISON:  Abdominal radiograph performed 08/15/2013  FINDINGS: The patient's enteric tube extends into the gastric antrum; its distal tip is not characterized. The visualized bowel gas pattern is grossly unremarkable. No free intra-abdominal air is identified, though evaluation for free air is suboptimal on this single supine image.  Bibasilar airspace opacification is noted. No acute osseous abnormalities are seen.  IMPRESSION: 1. Enteric tube noted extending into the gastric antrum; its distal tip is not characterized. 2. Bibasilar airspace opacification noted.   Electronically Signed   By: Roanna RaiderJeffery  Chang M.D.   On: 02015-01-31 03:40   Dg Abd Portable 1v  08/15/2013   CLINICAL DATA:  Nasogastric tube placement.  EXAM: PORTABLE ABDOMEN - 1 VIEW  COMPARISON:  Chest radiograph of same date  FINDINGS: Nasogastric tube terminates at the body of the stomach, looped within. Otherwise, moderately motion degraded exam. No gross free intraperitoneal air. Gas-filled small bowel loops which measure up to 3.0 cm, upper normal. Far right abdomen and low pelvis excluded.  IMPRESSION: Nasogastric tube looped in the stomach, with tip at gastric body.  Otherwise, motion degraded exam. Borderline small bowel dilatation, suggesting adynamic ileus versus partial small bowel obstruction.   Electronically Signed   By: Jeronimo GreavesKyle  Talbot M.D.   On: 08/15/2013 17:52    ASSESSMENT / PLAN:  PULMONARY A: Acute Respiratory Failure, extubated      Cardiopulmonary arrest  4/26     ?HCAP - RML infiltrate (CXR read from OSH suggests prior PNA was lingular infiltrate with left       basilar consolidation) P:   - Duonebs q4h - See ID section   CARDIOVASCULAR A:  S/p PEA arrest  NSTEMI Atrial fib - not on anticoag (?d/t h/o PUD with ulcer?) Chronic hypotension on Midodrine  H/o PVD - on [place] at home P:  - Accept SBP of >80 - d/c heparin gtt (treated x 48h) - midodrine TID per tube - ASA suppository  - d/c phenylephrine  RENAL A:   ESRD (TTS, last HD Sat 4/25) Lactic acidosis  Mild hypokalemia  P:   - Appreciate renal input, dialyzed for 3h yesterday, pulled 500cc while on phenylephrine  - Patient hypokalemic this morning, was given  2KCL runs, given this ESRD and GOC, will d/c remaining 2 doses   GASTROINTESTINAL A:  Protein cal malnutrition  On Lactulose at home - constipation + cirrhosis (?cryptogenic per chart review) with mod ascites on CT at OSH P:   - PPI for SUP - Tube feeds - Lactulose QID  HEMATOLOGIC A:   Macrocytic Anemia Thrombocytopenia, no obvious s/s of bleeding VTE ppx P:  - d/c heparin gtt, start subcutaneous heparin TID - Decline in all cell lines this AM, monitor as appropriate with GOC   INFECTIOUS A:   Leukocytosis Recent pneumonia, but concern for new HCAP vs Aspiration (CXR with RML infiltrate) Recent pseudomonas bacteremia (apparently source was non-healing gangrenous right ring finger wound). Most recent blood cultures were negative   Leukocytosis + tachycardia + RR and hypotension suggests septic shock in this chronically ill patient P:   - Follow blood & resp cx - Upon chart review, patient was adequately treated at OSH for pseudomonal bacteremia/PNA, will monitor off all antibiotics  - appreciate Wound consult  ENDOCRINE A:  DM Chronic AI (prednisone 5mg  three times/week at home) P:   - SSI - Hydrocortisone q6h (stress)  NEUROLOGIC A:  Acute encephalopathy (concern for hepatic enceph, but NH3  wnl on 4/28)      Chronic hip pain  P:   - Supportive care  MSK A: Avascular necrosis of left femoral head Left hip fracture  Se[facility] at [facility], sent back to [place] as deemed NOT A SURGICAL CANDIDATE.  P: - Morphine prn  GLOBAL:  Early this morning, patient made DNR/DNI, will allow family to arrive prior to making full comfort care  Stacy Gardner, MD (IMTS, PGY3) 09/08/13, 7:16 AM   Levy Pupa, MD, PhD 08/18/2013, 12:13 PM  Pulmonary and Critical Care [phone] or if no answer [phone]

## 2013-08-19 NOTE — Progress Notes (Signed)
Pt was noted to be diaphoretic, inc WOB, asking to "help me", increased HR to 130s, and increased ectopy.  Pt changed from [place] to NRB.  Dr. Clyde LundborgNiu called and assessed at the bedside.  Advised to replaced potassium.  Patient's family was called and patient made a DNR.  Will continue to assess.

## 2013-08-19 DEATH — deceased

## 2013-08-20 LAB — CULTURE, BLOOD (ROUTINE X 2)
CULTURE: NO GROWTH
Culture: NO GROWTH

## 2013-09-19 DEATH — deceased

## 2015-02-03 IMAGING — CR DG ABD PORTABLE 1V
1 series · 1 of 1 positions shown · non-contrast
Comparison: Chest radiograph of same date

CLINICAL DATA: Nasogastric tube placement.

EXAM:
PORTABLE ABDOMEN - 1 VIEW

[AP]
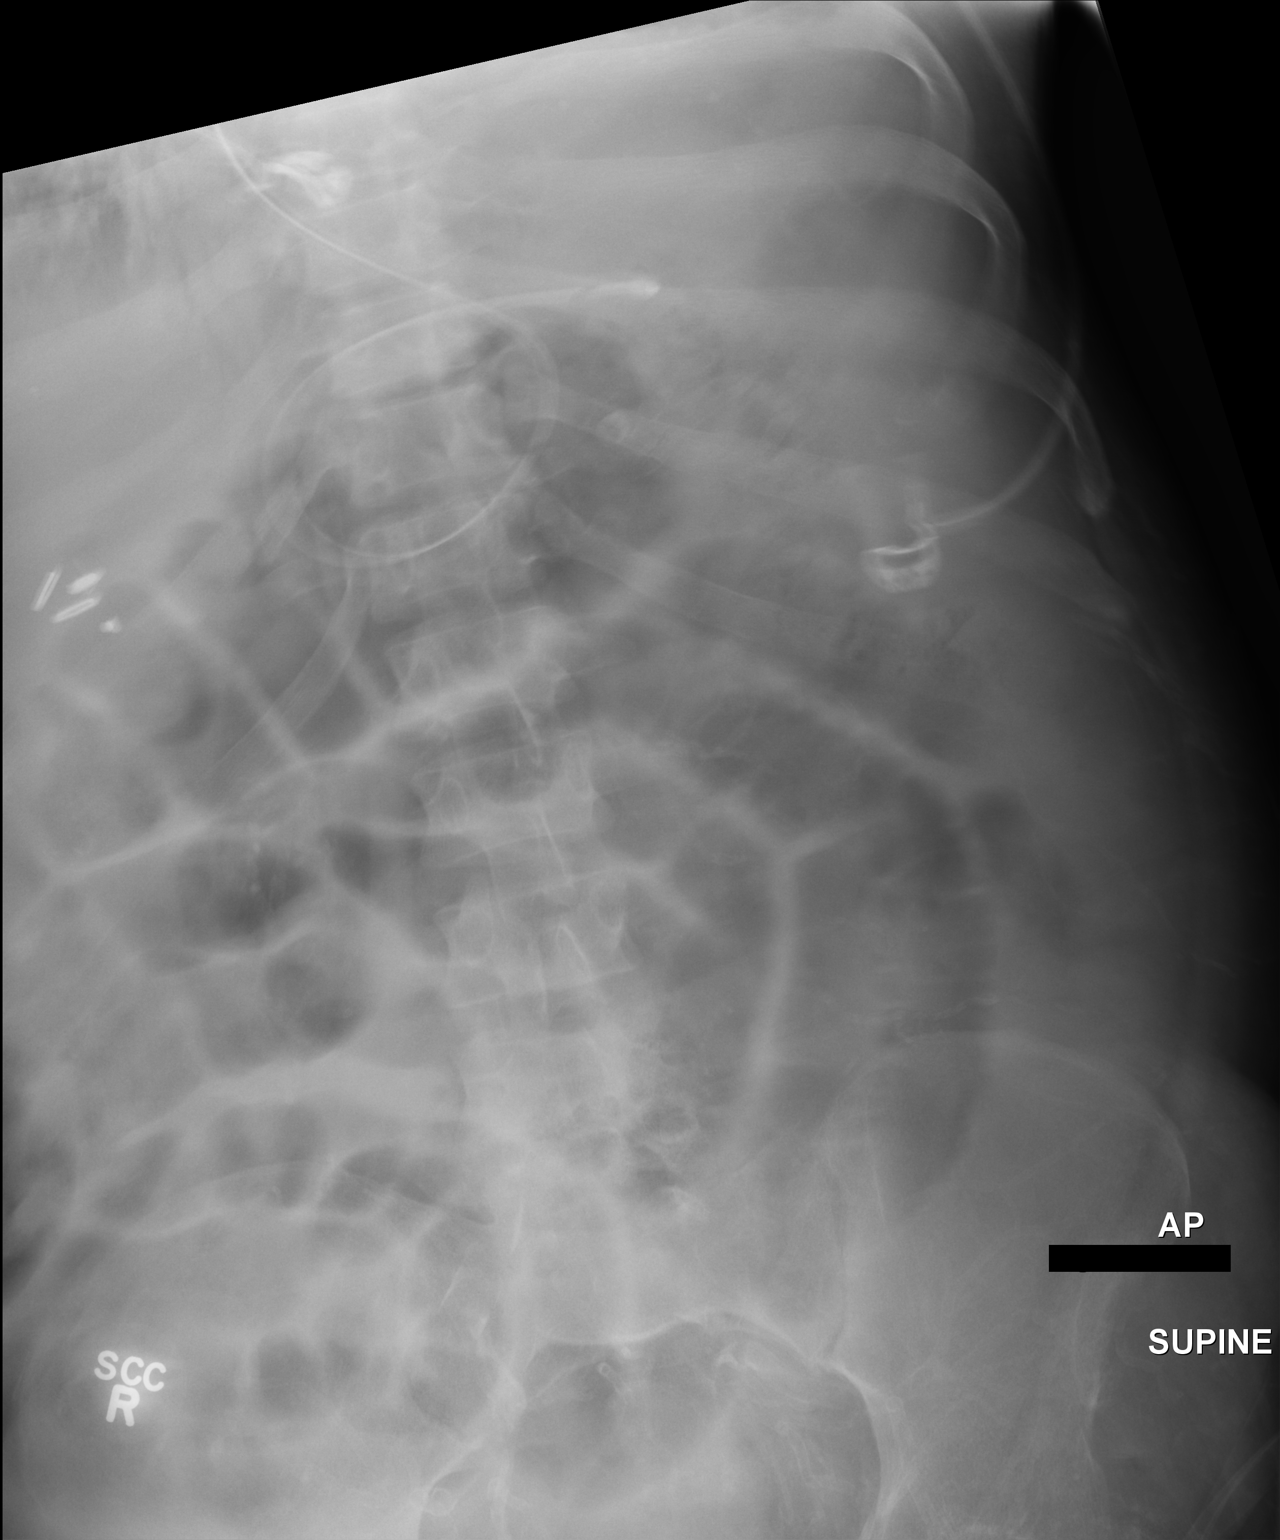

[1 of 1 positions shown; findings below may reference images not displayed]

FINDINGS: Nasogastric tube terminates at the body of the stomach, looped
within. Otherwise, moderately motion degraded exam. No gross free
intraperitoneal air. Gas-filled small bowel loops which measure up
to 3.0 cm, upper normal. Far right abdomen and low pelvis excluded.
IMPRESSION: Nasogastric tube looped in the stomach, with tip at gastric body.

Otherwise, motion degraded exam. Borderline small bowel dilatation,
suggesting adynamic ileus versus partial small bowel obstruction.
